# Patient Record
Sex: Female | Born: 1960 | Race: White | Hispanic: No | Marital: Married | Smoking: Never smoker
Health system: Southern US, Community
[De-identification: ages and names within clinical notes are randomized; demographics above are authoritative.]

## PROBLEM LIST (undated history)

## (undated) DIAGNOSIS — L409 Psoriasis, unspecified: Secondary | ICD-10-CM

## (undated) DIAGNOSIS — M1990 Unspecified osteoarthritis, unspecified site: Secondary | ICD-10-CM

## (undated) DIAGNOSIS — Z780 Asymptomatic menopausal state: Secondary | ICD-10-CM

## (undated) DIAGNOSIS — T7840XA Allergy, unspecified, initial encounter: Secondary | ICD-10-CM

## (undated) HISTORY — DX: Asymptomatic menopausal state: Z78.0

## (undated) HISTORY — DX: Psoriasis, unspecified: L40.9

## (undated) HISTORY — DX: Unspecified osteoarthritis, unspecified site: M19.90

## (undated) HISTORY — DX: Allergy, unspecified, initial encounter: T78.40XA

---

## 1999-02-23 ENCOUNTER — Other Ambulatory Visit: Admission: RE | Admit: 1999-02-23 | Discharge: 1999-02-23 | Payer: Self-pay | Admitting: Obstetrics and Gynecology

## 1999-09-09 ENCOUNTER — Inpatient Hospital Stay (HOSPITAL_COMMUNITY): Admission: AD | Admit: 1999-09-09 | Discharge: 1999-09-13 | Payer: Self-pay | Admitting: Obstetrics and Gynecology

## 1999-10-17 ENCOUNTER — Other Ambulatory Visit: Admission: RE | Admit: 1999-10-17 | Discharge: 1999-10-17 | Payer: Self-pay | Admitting: Obstetrics and Gynecology

## 2007-02-05 ENCOUNTER — Other Ambulatory Visit: Admission: RE | Admit: 2007-02-05 | Discharge: 2007-02-05 | Payer: Self-pay | Admitting: Obstetrics & Gynecology

## 2008-02-06 ENCOUNTER — Other Ambulatory Visit: Admission: RE | Admit: 2008-02-06 | Discharge: 2008-02-06 | Payer: Self-pay | Admitting: Obstetrics & Gynecology

## 2009-03-31 ENCOUNTER — Ambulatory Visit: Payer: Self-pay | Admitting: Gynecology

## 2012-11-22 LAB — HEPATIC FUNCTION PANEL
ALT: 20 U/L (ref 7–35)
Alkaline Phosphatase: 74 U/L (ref 25–125)

## 2012-11-22 LAB — CBC AND DIFFERENTIAL
Hemoglobin: 14.4 g/dL (ref 12.0–16.0)
Platelets: 222 10*3/uL (ref 150–399)
WBC: 4.8 10^3/mL

## 2012-11-22 LAB — LIPID PANEL
Cholesterol: 221 mg/dL — AB (ref 0–200)
HDL: 97 mg/dL — AB (ref 35–70)

## 2013-01-09 ENCOUNTER — Ambulatory Visit (INDEPENDENT_AMBULATORY_CARE_PROVIDER_SITE_OTHER): Payer: BC Managed Care – PPO | Admitting: Gynecology

## 2013-01-09 ENCOUNTER — Encounter: Payer: Self-pay | Admitting: Gynecology

## 2013-01-09 VITALS — BP 120/82 | Ht 64.5 in | Wt 168.0 lb

## 2013-01-09 DIAGNOSIS — R935 Abnormal findings on diagnostic imaging of other abdominal regions, including retroperitoneum: Secondary | ICD-10-CM

## 2013-01-09 DIAGNOSIS — R9389 Abnormal findings on diagnostic imaging of other specified body structures: Secondary | ICD-10-CM

## 2013-01-09 DIAGNOSIS — N949 Unspecified condition associated with female genital organs and menstrual cycle: Secondary | ICD-10-CM

## 2013-01-09 DIAGNOSIS — N840 Polyp of corpus uteri: Secondary | ICD-10-CM

## 2013-01-09 DIAGNOSIS — R140 Abdominal distension (gaseous): Secondary | ICD-10-CM | POA: Insufficient documentation

## 2013-01-09 DIAGNOSIS — R102 Pelvic and perineal pain: Secondary | ICD-10-CM

## 2013-01-09 DIAGNOSIS — Z01419 Encounter for gynecological examination (general) (routine) without abnormal findings: Secondary | ICD-10-CM

## 2013-01-09 DIAGNOSIS — N951 Menopausal and female climacteric states: Secondary | ICD-10-CM | POA: Insufficient documentation

## 2013-01-09 DIAGNOSIS — R141 Gas pain: Secondary | ICD-10-CM

## 2013-01-09 LAB — CBC WITH DIFFERENTIAL/PLATELET
Eosinophils Absolute: 0.2 10*3/uL (ref 0.0–0.7)
Eosinophils Relative: 3 % (ref 0–5)
Lymphocytes Relative: 41 % (ref 12–46)
Lymphs Abs: 2.2 10*3/uL (ref 0.7–4.0)
MCHC: 34.5 g/dL (ref 30.0–36.0)
Monocytes Absolute: 0.3 10*3/uL (ref 0.1–1.0)
Monocytes Relative: 6 % (ref 3–12)

## 2013-01-09 LAB — COMPREHENSIVE METABOLIC PANEL
ALT: 28 U/L (ref 0–35)
AST: 27 U/L (ref 0–37)
Albumin: 4.8 g/dL (ref 3.5–5.2)
Calcium: 9.7 mg/dL (ref 8.4–10.5)
Chloride: 103 mEq/L (ref 96–112)
Potassium: 3.9 mEq/L (ref 3.5–5.3)

## 2013-01-09 LAB — FOLLICLE STIMULATING HORMONE: FSH: 81.7 m[IU]/mL

## 2013-01-09 MED ORDER — PAROXETINE MESYLATE 7.5 MG PO CAPS: 7.5000 mg | ORAL_CAPSULE | Freq: Every day | ORAL | Status: DC

## 2013-01-09 NOTE — Patient Instructions (Addendum)
Menopause Menopause is the normal time of life when menstrual periods stop completely. Menopause is complete when you have missed 12 consecutive menstrual periods. It usually occurs between the ages of 48 to 55, with an average age of 51. Very rarely does a woman develop menopause before 53 years old. At menopause, your ovaries stop producing the female hormones, estrogen and progesterone. This can cause undesirable symptoms and also affect your health. Sometimes the symptoms may occur 4 to 5 years before the menopause begins. There is no relationship between menopause and:  Oral contraceptives.  Number of children you had.  Race.  The age your menstrual periods started (menarche). Heavy smokers and very thin women may develop menopause earlier in life. CAUSES  The ovaries stop producing the female hormones estrogen and progesterone.  Other causes include:  Surgery to remove both ovaries.  The ovaries stop functioning for no known reason.  Tumors of the pituitary gland in the brain.  Medical disease that affects the ovaries and hormone production.  Radiation treatment to the abdomen or pelvis.  Chemotherapy that affects the ovaries. SYMPTOMS   Hot flashes.  Night sweats.  Decrease in sex drive.  Vaginal dryness and thinning of the vagina causing painful intercourse.  Dryness of the skin and developing wrinkles.  Headaches.  Tiredness.  Irritability.  Memory problems.  Weight gain.  Bladder infections.  Hair growth of the face and chest.  Infertility. More serious symptoms include:  Loss of bone (osteoporosis) causing breaks (fractures).  Depression.  Hardening and narrowing of the arteries (atherosclerosis) causing heart attacks and strokes. DIAGNOSIS   When the menstrual periods have stopped for 12 straight months.  Physical exam.  Hormone studies of the blood. TREATMENT  There are many treatment choices and nearly as many questions about them.  The decisions to treat or not to treat menopausal changes is an individual choice made with your caregiver. Your caregiver can discuss the treatments with you. Together, you can decide which treatment will work best for you. Your treatment choices may include:   Hormone therapy (estorgen and progesterone).  Non-hormonal medications.  Treating the individual symptoms with medication (for example antidepressants for depression).  Herbal medications that may help specific symptoms.  Counseling by a psychiatrist or psychologist.  Group therapy.  Lifestyle changes including:  Eating healthy.  Regular exercise.  Limiting caffeine and alcohol.  Stress management and meditation.  No treatment. HOME CARE INSTRUCTIONS   Take the medication your caregiver gives you as directed.  Get plenty of sleep and rest.  Exercise regularly.  Eat a diet that contains calcium (good for the bones) and soy products (acts like estrogen hormone).  Avoid alcoholic beverages.  Do not smoke.  If you have hot flashes, dress in layers.  Take supplements, calcium and vitamin D to strengthen bones.  You can use over-the-counter lubricants or moisturizers for vaginal dryness.  Group therapy is sometimes very helpful.  Acupuncture may be helpful in some cases. SEEK MEDICAL CARE IF:   You are not sure you are in menopause.  You are having menopausal symptoms and need advice and treatment.  You are still having menstrual periods after age 55.  You have pain with intercourse.  Menopause is complete (no menstrual period for 12 months) and you develop vaginal bleeding.  You need a referral to a specialist (gynecologist, psychiatrist or psychologist) for treatment. SEEK IMMEDIATE MEDICAL CARE IF:   You have severe depression.  You have excessive vaginal bleeding.  You fell and   think you have a broken bone.  You have pain when you urinate.  You develop leg or chest pain.  You have a fast  pounding heart beat (palpitations).  You have severe headaches.  You develop vision problems.  You feel a lump in your breast.  You have abdominal pain or severe indigestion. Document Released: 01/27/2004 Document Revised: 01/29/2012 Document Reviewed: 09/03/2008 ExitCare Patient Information 2013 [employer].   Hormone Therapy At menopause, your body begins making less estrogen and progesterone hormones. This causes the body to stop having menstrual periods. This is because estrogen and progesterone hormones control your periods and menstrual cycle. A lack of estrogen may cause symptoms such as:  Hot flushes (or hot flashes).  Vaginal dryness.  Dry skin.  Loss of sex drive.  Risk of bone loss (osteoporosis). When this happens, you may choose to take hormone therapy to get back the estrogen lost during menopause. When the hormone estrogen is given alone, it is usually referred to as ET (Estrogen Therapy). When the hormone progestin is combined with estrogen, it is generally called HT (Hormone Therapy). This was formerly known as hormone replacement therapy (HRT). Your caregiver can help you make a decision on what will be best for you. The decision to use HT seems to change often as new studies are done. Many studies do not agree on the benefits of hormone replacement therapy. LIKELY BENEFITS OF HT INCLUDE PROTECTION FROM:  Hot Flushes (also called hot flashes) - A hot flush is a sudden feeling of heat that spreads over the face and body. The skin may redden like a blush. It is connected with sweats and sleep disturbance. Women going through menopause may have hot flushes a few times a month or several times per day depending on the woman.  Osteoporosis (bone loss)- Estrogen helps guard against bone loss. After menopause, a woman's bones slowly lose calcium and become weak and brittle. As a result, bones are more likely to break. The hip, wrist, and spine are affected most often.  Hormone therapy can help slow bone loss after menopause. Weight bearing exercise and taking calcium with vitamin D also can help prevent bone loss. There are also medications that your caregiver can prescribe that can help prevent osteoporosis.  Vaginal Dryness - Loss of estrogen causes changes in the vagina. Its lining may become thin and dry. These changes can cause pain and bleeding during sexual intercourse. Dryness can also lead to infections. This can cause burning and itching. (Vaginal estrogen treatment can help relieve pain, itching, and dryness.)  Urinary Tract Infections are more common after menopause because of lack of estrogen. Some women also develop urinary incontinence because of low estrogen levels in the vagina and bladder.  Possible other benefits of estrogen include a positive effect on mood and short-term memory in women. RISKS AND COMPLICATIONS  Using estrogen alone without progesterone causes the lining of the uterus to grow. This increases the risk of lining of the uterus (endometrial) cancer. Your caregiver should give another hormone called progestin if you have a uterus.  Women who take combined (estrogen and progestin) HT appear to have an increased risk of breast cancer. The risk appears to be small, but increases throughout the time that HT is taken.  Combined therapy also makes the breast tissue slightly denser which makes it harder to read mammograms (breast X-rays).  Combined, estrogen and progesterone therapy can be taken together every day, in which case there may be spotting of blood. HT   therapy can be taken cyclically in which case you will have menstrual periods. Cyclically means HT is taken for a set amount of days, then not taken, then this process is repeated.  HT may increase the risk of stroke, heart attack, breast cancer and forming blood clots in your leg.  Transdermal estrogen (estrogen that is absorbed through the skin with a patch or a cream) may  have more positive results with:  Cholesterol.  Blood pressure.  Blood clots. Having the following conditions may indicate you should not have HT:  Endometrial cancer.  Liver disease.  Breast cancer.  Heart disease.  History of blood clots.  Stroke. TREATMENT   If you choose to take HT and have a uterus, usually estrogen and progestin are prescribed.  Your caregiver will help you decide the best way to take the medications.  Possible ways to take estrogen include:  Pills.  Patches.  Gels.  Sprays.  Vaginal estrogen cream, rings and tablets.  It is best to take the lowest dose possible that will help your symptoms and take them for the shortest period of time that you can.  Hormone therapy can help relieve some of the problems (symptoms) that affect women at menopause. Before making a decision about HT, talk to your caregiver about what is best for you. Be well informed and comfortable with your decisions. HOME CARE INSTRUCTIONS   Follow your caregivers advice when taking the medications.  A Pap test is done to screen for cervical cancer.  The first Pap test should be done at age 21.  Between ages 21 and 29, Pap tests are repeated every 2 years.  Beginning at age 30, you are advised to have a Pap test every 3 years as long as your past 3 Pap tests have been normal.  Some women have medical problems that increase the chance of getting cervical cancer. Talk to your caregiver about these problems. It is especially important to talk to your caregiver if a new problem develops soon after your last Pap test. In these cases, your caregiver may recommend more frequent screening and Pap tests.  The above recommendations are the same for women who have or have not gotten the vaccine for HPV (Human Papillomavirus).  If you had a hysterectomy for a problem that was not a cancer or a condition that could lead to cancer, then you no longer need Pap tests. However, even if  you no longer need a Pap test, a regular exam is a good idea to make sure no other problems are starting.   If you are between ages 65 and 70, and you have had normal Pap tests going back 10 years, you no longer need Pap tests. However, even if you no longer need a Pap test, a regular exam is a good idea to make sure no other problems are starting.   If you have had past treatment for cervical cancer or a condition that could lead to cancer, you need Pap tests and screening for cancer for at least 20 years after your treatment.  If Pap tests have been discontinued, risk factors (such as a new sexual partner) need to be re-assessed to determine if screening should be resumed.  Some women may need screenings more often if they are at high risk for cervical cancer.  Get mammograms done as per the advice of your caregiver. SEEK IMMEDIATE MEDICAL CARE IF:  You develop abnormal vaginal bleeding.  You have pain or swelling in your legs,   shortness of breath, or chest pain.  You develop dizziness or headaches.  You have lumps or changes in your breasts or armpits.  You have slurred speech.  You develop weakness or numbness of your arms or legs.  You have pain, burning, or bleeding when urinating.  You develop abdominal pain. Document Released: 08/05/2003 Document Revised: 01/29/2012 Document Reviewed: 11/23/2010 ExitCare Patient Information 2013 [employer].   

## 2013-01-09 NOTE — Progress Notes (Signed)
Amanda Barber 05/25/1961 [id number]   History:    52 y.o.  for annual gyn exam who has not been seen in the office since 2010. She brought some records from her prior physician hearing Ginette Otto dating 2011 2012. It appears that she had a thickened endometrium in 2011 2012. We do not have any notes from 2013. She does states that she does not recall she had an endometrial biopsy or not. She is having some mild vasomotor symptoms but but no dyspareunia or vaginal dryness. She is feeling bloated achy in her lower abdomen but states that she has no GU or GI complaints. Her husband has had a vasectomy. She has not had a menstrual cycle since last year. Patient denies any prior history of abnormal Pap smears. She refused the flu vaccine and states her Tdap vaccine is up-to-date. Her last mammogram was in 2012 which was reported to be normal.  Past medical history,surgical history, family history and social history were all reviewed and documented in the EPIC chart.  Gynecologic History Patient's last menstrual period was 07/09/2012. Contraception: vasectomy Last Pap: 2012. Results were: normal Last mammogram: 2012. Results were: normal  Obstetric History OB History   Grav Para Term Preterm Abortions TAB SAB Ect Mult Living   2 2        2      # Outc Date GA Lbr Len/2nd Wgt Sex Del Anes PTL Lv   1 PAR            2 PAR                ROS: A ROS was performed and pertinent positives and negatives are included in the history.  GENERAL: No fevers or chills. HEENT: No change in vision, no earache, sore throat or sinus congestion. NECK: No pain or stiffness. CARDIOVASCULAR: No chest pain or pressure. No palpitations. PULMONARY: No shortness of breath, cough or wheeze. GASTROINTESTINAL: No abdominal pain, nausea, vomiting or diarrhea, melena or bright red blood per rectum. GENITOURINARY: No urinary frequency, urgency, hesitancy or dysuria. MUSCULOSKELETAL: No joint or muscle pain, no back pain, no recent  trauma. DERMATOLOGIC: No rash, no itching, no lesions. ENDOCRINE: No polyuria, polydipsia, no heat or cold intolerance. No recent change in weight. HEMATOLOGICAL: No anemia or easy bruising or bleeding. NEUROLOGIC: No headache, seizures, numbness, tingling or weakness. PSYCHIATRIC: No depression, no loss of interest in normal activity or change in sleep pattern.     Exam: chaperone present  BP 120/82  Ht 5' 4.5" (1.638 m)  Wt 168 lb (76.204 kg)  BMI 28.4 kg/m2  LMP 07/09/2012  Body mass index is 28.4 kg/(m^2).  General appearance : Well developed well nourished female. No acute distress HEENT: Neck supple, trachea midline, no carotid bruits, no thyroidmegaly Lungs: Clear to auscultation, no rhonchi or wheezes, or rib retractions  Heart: Regular rate and rhythm, no murmurs or gallops Breast:Examined in sitting and supine position were symmetrical in appearance, no palpable masses or tenderness,  no skin retraction, no nipple inversion, no nipple discharge, no skin discoloration, no axillary or supraclavicular lymphadenopathy Abdomen: no palpable masses or tenderness, no rebound or guarding Extremities: no edema or skin discoloration or tenderness  Pelvic:  Bartholin, Urethra, Skene Glands: Within normal limits             Vagina: No gross lesions or discharge  Cervix: No gross lesions or discharge  Uterus  anteverted, normal size, shape and consistency, non-tender and mobile  Adnexa  Without masses  or tenderness  Anus and perineum  normal   Rectovaginal  normal sphincter tone without palpated masses or tenderness             Hemoccult not done Bovie referred to gastroenterologist for screening colonoscopy     Assessment/Plan:  52 y.o. female for annual exam who appears to be in a menopausal state. We will check her [facility] today along with her CBC, comprehensive metabolic panel. She was counseled for an endometrial biopsy because of the history of thickened endometrium. The cervix was  cleansed with Betadine solution and a sterile Pipelle was introduced into the intrauterine cavity and tissue was obtained for histological evaluation. Patient will return back next week for sonohysterogram to look at the intrauterine cavity as well as endometrial thickness and look at her ovaries. If all the above tests are normal we will wait until she sees the gastroenterologist for further evaluation as well as to proceed with her colonoscopy since she has not had a baseline. She denies any family history of any GI malignancies or polyps. She was given a requisition to schedule her mammogram. We discussed importance of monthly self breast examination. We discussed importance of calcium vitamin D for osteoporosis prevention. We discussed some time next year to proceed with doing a baseline bone density study. As to her vasomotor symptoms she was taking unopposed estrogen from over-the-counter which I told her that she had to discontinue. We did discussed Brisdelle 7.5 mg to help with her symptoms this being a nonhormonal product (Paroxetine) which she was interested and prescription and literature was provided.    Ok Edwards MD, 12:54 PM 01/09/2013

## 2013-01-10 ENCOUNTER — Encounter: Payer: Self-pay | Admitting: Gynecology

## 2013-01-10 ENCOUNTER — Other Ambulatory Visit: Payer: Self-pay | Admitting: Gynecology

## 2013-01-10 DIAGNOSIS — Z1231 Encounter for screening mammogram for malignant neoplasm of breast: Secondary | ICD-10-CM

## 2013-01-20 ENCOUNTER — Ambulatory Visit: Payer: BC Managed Care – PPO | Admitting: Gynecology

## 2013-01-20 ENCOUNTER — Other Ambulatory Visit: Payer: BC Managed Care – PPO

## 2013-01-22 ENCOUNTER — Other Ambulatory Visit: Payer: Self-pay | Admitting: Gynecology

## 2013-02-07 ENCOUNTER — Ambulatory Visit (INDEPENDENT_AMBULATORY_CARE_PROVIDER_SITE_OTHER): Payer: BC Managed Care – PPO | Admitting: Gynecology

## 2013-02-07 ENCOUNTER — Other Ambulatory Visit: Payer: Self-pay | Admitting: Gynecology

## 2013-02-07 ENCOUNTER — Ambulatory Visit (INDEPENDENT_AMBULATORY_CARE_PROVIDER_SITE_OTHER): Payer: BC Managed Care – PPO

## 2013-02-07 DIAGNOSIS — R9389 Abnormal findings on diagnostic imaging of other specified body structures: Secondary | ICD-10-CM

## 2013-02-07 DIAGNOSIS — N840 Polyp of corpus uteri: Secondary | ICD-10-CM

## 2013-02-07 DIAGNOSIS — Z780 Asymptomatic menopausal state: Secondary | ICD-10-CM

## 2013-02-07 DIAGNOSIS — R935 Abnormal findings on diagnostic imaging of other abdominal regions, including retroperitoneum: Secondary | ICD-10-CM

## 2013-02-07 NOTE — Progress Notes (Signed)
Patient is a 52 year old who was asked to come in to the office today to discuss her recent endometrial biopsy and to do a sonohysterogram today. She was seen in the office on February 20 her annual gynecological exam. Review of her record and indicated that she had been evaluated in the past by another gynecologist in the community and the records indicated that in 2011, in 2012 there appeared to be endometrial thickening on ultrasound. She was also here to discuss her vasomotor symptoms and to confirm that her [facility] was in the menopausal range. Her husband has had a vasectomy. She had been complaining of bloating and lower abdominal discomfort and has not had a screening colonoscopy yet.  FSH elevated 85  Endometrial biopsy done 01/09/2013: Endometrium, biopsy, uterus - FRAGMENTS OF BENIGN ENDOMETRIAL-TYPE POLYP. - BACKGROUND PROLIFERATIVE PATTERN ENDOMETRIUM. - NO HYPERPLASIA, ATYPIA OR MALIGNANCY IDENTIFIED.  Sonohysterogram today: Ultrasound demonstrated uterus measured 8.5 x 5.4 x 4.8 cm with endometrial stripe of 5.1 mm. Normal-appearing uterus and ovaries. The cervix was then cleansed with Betadine solution and a sterile Pipelle was introduced into the intrauterine cavity and no defects were noted.  Assessment/plan: Patient will be referred to the gastroenterologist for her overdue baseline screening colonoscopy. She was reassured of the findings on the ultrasound and endometrial biopsy. Her vasomotor symptoms are not as significant but are there and I had prescribed her Brisdelle 7.5 mg daily to help with her symptoms which she will begin to take shortly.

## 2013-02-07 NOTE — Patient Instructions (Addendum)

## 2013-02-10 ENCOUNTER — Encounter: Payer: Self-pay | Admitting: Gastroenterology

## 2013-02-10 ENCOUNTER — Telehealth: Payer: Self-pay

## 2013-02-10 NOTE — Telephone Encounter (Signed)
Patient called asking me where she had scheduled her mammogram at.  I do not have a record of that and told her most likely [facility] and provided her with that ph number. I also gave her [place] phone number. She feels she probably missed her appointment.  She will call and if she cannot locate place she will call me back.

## 2013-02-12 ENCOUNTER — Encounter: Payer: Self-pay | Admitting: Gynecology

## 2013-02-13 ENCOUNTER — Ambulatory Visit: Payer: BC Managed Care – PPO

## 2013-03-04 ENCOUNTER — Ambulatory Visit: Payer: BC Managed Care – PPO

## 2013-03-11 ENCOUNTER — Encounter: Payer: Self-pay | Admitting: Gastroenterology

## 2013-03-11 ENCOUNTER — Other Ambulatory Visit (INDEPENDENT_AMBULATORY_CARE_PROVIDER_SITE_OTHER): Payer: BC Managed Care – PPO

## 2013-03-11 ENCOUNTER — Ambulatory Visit (INDEPENDENT_AMBULATORY_CARE_PROVIDER_SITE_OTHER): Payer: BC Managed Care – PPO | Admitting: Gastroenterology

## 2013-03-11 VITALS — BP 110/70 | HR 80 | Ht 67.0 in | Wt 166.5 lb

## 2013-03-11 DIAGNOSIS — N390 Urinary tract infection, site not specified: Secondary | ICD-10-CM

## 2013-03-11 DIAGNOSIS — R140 Abdominal distension (gaseous): Secondary | ICD-10-CM

## 2013-03-11 DIAGNOSIS — R109 Unspecified abdominal pain: Secondary | ICD-10-CM

## 2013-03-11 DIAGNOSIS — R141 Gas pain: Secondary | ICD-10-CM

## 2013-03-11 LAB — URINALYSIS, ROUTINE W REFLEX MICROSCOPIC
Nitrite: NEGATIVE
Total Protein, Urine: NEGATIVE
Urine Glucose: NEGATIVE
pH: 7 (ref 5.0–8.0)

## 2013-03-11 MED ORDER — MOVIPREP 100 G PO SOLR: 1.0000 | Freq: Once | ORAL | Status: DC

## 2013-03-11 NOTE — Patient Instructions (Addendum)
You will have labs checked today in the basement lab.  Please head down after you check out with the front desk  (urine analysis, urine culture, celiac panel). Trial of dairy free. You will be set up for a colonoscopy for routine screening (LEC, moderate sedation).                                               We are excited to introduce MyChart, a new best-in-class service that provides you online access to important information in your electronic medical record. We want to make it easier for you to view your health information - all in one secure location - when and where you need it. We expect MyChart will enhance the quality of care and service we provide.  When you register for MyChart, you can:    View your test results.    Request appointments and receive appointment reminders via email.    Request medication renewals.    View your medical history, allergies, medications and immunizations.    Communicate with your physician's office through a password-protected site.    Conveniently print information such as your medication lists.  To find out if MyChart is right for you, please talk to a member of our clinical staff today. We will gladly answer your questions about this free health and wellness tool.  If you are age 40 or older and want a member of your family to have access to your record, you must provide written consent by completing a proxy form available at our office. Please speak to our clinical staff about guidelines regarding accounts for patients younger than age 64.  As you activate your MyChart account and need any technical assistance, please call the MyChart technical support line at [phone]-CHART [phone]) or email your question to [email or web address]. If you email your question(s), please include your name, a return phone number and the best time to reach you.  If you have non-urgent health-related questions, you can send a message to our office through  [email or web address]. If you have a medical emergency, call 911.  Thank you for using MyChart as your new health and wellness resource!   MyChart licensed from [employer],  [id number]. Patents Pending.

## 2013-03-11 NOTE — Progress Notes (Signed)
HPI: This is a    very pleasant 52 year old woman whom I am meeting for the first time today.  Gyne workup reassuring recently.    She has trouble with bloating.  Can have a lot of more persistent bloating.  She has 2-3 times per week.  Hasn't paid much attention.  Has improved.  She was having some back aches as well.  Muscularly sore in abd.  January she stayed off dairy.  Two recent UTIs this past January.  "Finally" went through menopause.  Has not tried the recommended supplements.  Has had psoriasis in the past.  No real bowel troubles.  Pretty regular.  No overt bleeding.    She would like a urinalysis today to check to see if she still has urinary tract infection    Review of systems: Pertinent positive and negative review of systems were noted in the above HPI section. Complete review of systems was performed and was otherwise normal.    Past Medical History  Diagnosis Date  . NSVD (normal spontaneous vaginal delivery)   . Anxiety   . Psoriasis   . Urinary tract infection     Past Surgical History  Procedure Laterality Date  . Cesarean section      Current Outpatient Prescriptions  Medication Sig Dispense Refill  . Loratadine-Pseudoephedrine (CLARITIN-D 24 HOUR PO) Take 1 tablet by mouth daily.       No current facility-administered medications for this visit.    Allergies as of 03/11/2013 - Review Complete 03/11/2013  Allergen Reaction Noted  . Penicillins  01/09/2013    Family History  Problem Relation Age of Onset  . Hypertension Mother   . Atrial fibrillation Mother     History   Social History  . Marital Status: Married    Spouse Name: N/A    Number of Children: N/A  . Years of Education: N/A   Occupational History  . Not on file.   Social History Main Topics  . Smoking status: Never Smoker   . Smokeless tobacco: Never Used  . Alcohol Use: Yes     Comment: OCC  . Drug Use: No  . Sexually Active: Yes   Other Topics Concern  .  Not on file   Social History Narrative  . No narrative on file       Physical Exam: BP 110/70  Pulse 80  Ht 5\' 7"  (1.702 m)  Wt 166 lb 8 oz (75.524 kg)  BMI 26.07 kg/m2  LMP 07/09/2012 Constitutional: generally well-appearing Psychiatric: alert and oriented x3 Eyes: extraocular movements intact Mouth: oral pharynx moist, no lesions Neck: supple no lymphadenopathy Cardiovascular: heart regular rate and rhythm Lungs: clear to auscultation bilaterally Abdomen: soft, nontender, nondistended, no obvious ascites, no peritoneal signs, normal bowel sounds Extremities: no lower extremity edema bilaterally Skin: no lesions on visible extremities    Assessment and plan: 52 y.o. female with  intermittent bloating, routine risk for colon cancer  I suspect her bloating symptom is mostly related to dietary intake, perhaps underlying celiac sprue. She will get a blood test, celiac sprue panel. She requests a urinalysis which I am happy to do since she thinks she might have urinary tract infection again. She is due for colonoscopy for routine cancer screening and we will set that up for her as well. She will do a dairy free trial to see if that is planning a role in some of her bloating

## 2013-03-12 LAB — URINE CULTURE: Colony Count: 8000

## 2013-03-12 LAB — CELIAC PANEL 10
Gliadin IgG: 3.6 U/mL (ref <20)
IgA: 151 mg/dL (ref 69–380)

## 2013-03-13 ENCOUNTER — Telehealth: Payer: Self-pay | Admitting: Gastroenterology

## 2013-03-13 NOTE — Telephone Encounter (Signed)
Pt was notified that the results have not been reviewed as of yet.  I will call as soon as available

## 2013-03-24 ENCOUNTER — Encounter: Payer: Self-pay | Admitting: Internal Medicine

## 2013-03-24 ENCOUNTER — Encounter: Payer: Self-pay | Admitting: Gastroenterology

## 2013-03-24 NOTE — Telephone Encounter (Signed)
Amanda Barber/Jordan - No hurry that i can see - just an FYI re: "cancelation list" request

## 2013-03-31 ENCOUNTER — Other Ambulatory Visit: Payer: BC Managed Care – PPO | Admitting: Gastroenterology

## 2013-04-09 ENCOUNTER — Ambulatory Visit: Payer: BC Managed Care – PPO

## 2013-04-29 ENCOUNTER — Other Ambulatory Visit (HOSPITAL_COMMUNITY): Payer: Self-pay | Admitting: Rheumatology

## 2013-04-29 DIAGNOSIS — Z780 Asymptomatic menopausal state: Secondary | ICD-10-CM

## 2013-04-30 ENCOUNTER — Ambulatory Visit (HOSPITAL_COMMUNITY)
Admission: RE | Admit: 2013-04-30 | Discharge: 2013-04-30 | Disposition: A | Discharge status: Home / Self Care | Payer: BC Managed Care – PPO | Source: Ambulatory Visit | Attending: Rheumatology | Admitting: Rheumatology

## 2013-04-30 DIAGNOSIS — Z780 Asymptomatic menopausal state: Secondary | ICD-10-CM

## 2013-04-30 DIAGNOSIS — Z13820 Encounter for screening for osteoporosis: Secondary | ICD-10-CM | POA: Insufficient documentation

## 2013-07-09 ENCOUNTER — Encounter: Payer: Self-pay | Admitting: Internal Medicine

## 2013-07-09 ENCOUNTER — Ambulatory Visit (INDEPENDENT_AMBULATORY_CARE_PROVIDER_SITE_OTHER): Payer: BC Managed Care – PPO | Admitting: Internal Medicine

## 2013-07-09 VITALS — BP 112/82 | HR 69 | Temp 98.4°F | Ht 67.0 in | Wt 177.1 lb

## 2013-07-09 DIAGNOSIS — F909 Attention-deficit hyperactivity disorder, unspecified type: Secondary | ICD-10-CM | POA: Insufficient documentation

## 2013-07-09 DIAGNOSIS — N951 Menopausal and female climacteric states: Secondary | ICD-10-CM | POA: Insufficient documentation

## 2013-07-09 DIAGNOSIS — F411 Generalized anxiety disorder: Secondary | ICD-10-CM | POA: Insufficient documentation

## 2013-07-09 DIAGNOSIS — Z1211 Encounter for screening for malignant neoplasm of colon: Secondary | ICD-10-CM

## 2013-07-09 DIAGNOSIS — N959 Unspecified menopausal and perimenopausal disorder: Secondary | ICD-10-CM

## 2013-07-09 DIAGNOSIS — K589 Irritable bowel syndrome without diarrhea: Secondary | ICD-10-CM | POA: Insufficient documentation

## 2013-07-09 MED ORDER — PAROXETINE HCL 10 MG PO TABS: 10.0000 mg | ORAL_TABLET | ORAL | Status: DC

## 2013-07-09 NOTE — Assessment & Plan Note (Signed)
Reviewed negative evaluation for other causes of fatigue and irritability by numerous other specialists throughout 2014. Reviewed possible overlap with ADD/hyperactive state. Will refer to behavioral health for further battery testing to clarify same. Associated with IBS and symptomatic menopausal transition - Reviewed efficacy of SSRI for management of all these symptoms/conditions Patient understands and agrees to medication trial, low-dose generic paroxetine selected we reviewed potential risk/benefit and possible side effects - pt understands and agrees to same  Followup in 6 weeks on medication and symptoms, titrate as needed

## 2013-07-09 NOTE — Progress Notes (Signed)
Subjective:    Patient ID: Amanda Barber, female    DOB: 1961/08/18, 52 y.o.   MRN: [id number]  HPI  New patient to me, transfer from [facility] to establish with new primary care provider (her mother is my patient -Cecile Sheerer) Reviewed chronic medical issues today:  Arthralgias, question psoriatic arthritis. Working with rheumatology for same since January 2014. Takes over-the-counter anti-inflammatory and/or Tylenol for same  GI issues: bloating, discomfort -GI evaluation in spring 2014 for same unremarkable for celiac disease. Improves with careful attention to diet such as dairy restriction and minimized fried food/fats. Has not yet proceeded with screening colonoscopy as recommended  Postmenopausal symptoms. Gyn evaluation for same spring 2014 reviewed. Did not begin trial of SSRI as recommended.  Anxiety. Manifest with irritability, excitability. Never on medications for same. Exacerbated by hormonal changes with menopause. Overlap with hyperactivity and questions possible diagnosis of ADD given prevalence of similar symptoms throughout childhood and adult life, worsened since menopause.  Past Medical History  Diagnosis Date  . NSVD (normal spontaneous vaginal delivery)   . Anxiety   . Psoriasis   . Postmenopausal   . Psoriatic arthritis    Family History  Problem Relation Age of Onset  . Hypertension Mother   . Atrial fibrillation Mother    History  Substance Use Topics  . Smoking status: Never Smoker   . Smokeless tobacco: Never Used  . Alcohol Use: Yes     Comment: OCC    Review of Systems Constitutional: Negative for fever; +unexpected weight gain.  Respiratory: Negative for cough and shortness of breath.   Cardiovascular: Negative for chest pain or palpitations.  Gastrointestinal: No bowel changes. chronic bloating, distention, "gurgling" Musculoskeletal: Negative for gait problem or joint swelling. Generalized arthralgias Skin: Negative for rash.  Neurological:  Negative for dizziness or headache.  No other specific complaints in a complete review of systems (except as listed in HPI above).     Objective:   Physical Exam BP 112/82  Pulse 69  Temp(Src) 98.4 F (36.9 C) (Oral)  Ht 5\' 7"  (1.702 m)  Wt 177 lb 1.9 oz (80.341 kg)  BMI 27.73 kg/m2  SpO2 95%  LMP 07/09/2012 Wt Readings from Last 3 Encounters:  07/09/13 177 lb 1.9 oz (80.341 kg)  03/11/13 166 lb 8 oz (75.524 kg)  01/09/13 168 lb (76.204 kg)   Constitutional: She appears well-developed and well-nourished. No distress.  HENT: Head: Normocephalic and atraumatic. Ears: B TMs ok, no erythema or effusion; Nose: Nose normal. Mouth/Throat: Oropharynx is clear and moist. No oropharyngeal exudate.  Eyes: Conjunctivae and EOM are normal. Pupils are equal, round, and reactive to light. No scleral icterus.  Neck: Normal range of motion. Neck supple. No JVD present. No thyromegaly present.  Cardiovascular: Normal rate, regular rhythm and normal heart sounds.  No murmur heard. No BLE edema. Pulmonary/Chest: Effort normal and breath sounds normal. No respiratory distress. She has no wheezes.  Abdominal: Soft. Bowel sounds are normal. She exhibits no distension. There is no tenderness. no masses Musculoskeletal: Normal range of motion, no joint effusions. No gross deformities Neurological: She is alert and oriented to person, place, and time. No cranial nerve deficit. Coordination, balance, strength, speech and gait are normal.  Skin: Skin is warm and dry. No rash noted. No erythema.  Psychiatric: She has an anxious and distractible mood and affect. Her behavior is hyperactive. Judgment and thought content normal.   Lab Results  Component Value Date   WBC 5.3 01/09/2013   HGB  14.0 01/09/2013   HCT 40.6 01/09/2013   PLT 253 01/09/2013   GLUCOSE 85 01/09/2013   CHOL 221* 11/22/2012   TRIG 92 11/22/2012   HDL 97* 11/22/2012   LDLCALC 105 11/22/2012   ALT 28 01/09/2013   AST 27 01/09/2013   NA 142 01/09/2013    K 3.9 01/09/2013   CL 103 01/09/2013   CREATININE 0.70 01/09/2013   BUN 10 01/09/2013   CO2 29 01/09/2013   TSH 1.21 11/22/2012       Assessment & Plan:   See problem list. Medications and labs reviewed today.  Time spent with pt today 60 minutes, greater than 50% time spent counseling patient on anxiety, PMP symptoms, IBS and ?ADD. Also medication review and review of prior records as brought with patient from other providers today and in our EMR

## 2013-07-09 NOTE — Assessment & Plan Note (Signed)
Reviewed gynecology evaluation February 2014. Reassurance on diagnosis including symptom manifestation as experienced by patient. Patient agrees to low dose SSRI trial for management of symptoms -ex done

## 2013-07-09 NOTE — Patient Instructions (Signed)
It was good to see you today. We have reviewed your prior records including labs and tests today Medications reviewed and updated: start low dose fluoxetine now for postmenopausal symptoms, irritable bowel and anxiety No other medication changes recommended at this time. Your prescription(s) have been submitted to your pharmacy. Please take as directed and contact our office if you believe you are having problem(s) with the medication(s). we'll make referral to  Behavioral health for evaluation of possible ADD. Also we'll make referral to new provider and gastroenterology for screening colonoscopy + IBS. Our office will contact you regarding appointment(s) once made. Please schedule followup in 6 weeks to review symptoms and medications, call sooner if problems.  Diet and Irritable Bowel Syndrome  No cure has been found for irritable bowel syndrome (IBS). Many options are available to treat the symptoms. Your caregiver will give you the best treatments available for your symptoms. He or she will also encourage you to manage stress and to make changes to your diet. You need to work with your caregiver and Registered Dietician to find the best combination of medicine, diet, counseling, and support to control your symptoms. The following are some diet suggestions. FOODS THAT MAKE IBS WORSE  Fatty foods, such as [place] fries.  Milk products, such as cheese or ice cream.  Chocolate.  Alcohol.  Caffeine (found in coffee and some sodas).  Carbonated drinks, such as soda. If certain foods cause symptoms, you should eat less of them or stop eating them. FOOD JOURNAL   Keep a journal of the foods that seem to cause distress. Write down:  What you are eating during the day and when.  What problems you are having after eating.  When the symptoms occur in relation to your meals.  What foods always make you feel badly.  Take your notes with you to your caregiver to see if you should stop eating  certain foods. FOODS THAT MAKE IBS BETTER Fiber reduces IBS symptoms, especially constipation, because it makes stools soft, bulky, and easier to pass. Fiber is found in bran, bread, cereal, beans, fruit, and vegetables. Examples of foods with fiber include:  Apples.  Peaches.  Pears.  Berries.  Figs.  Broccoli, raw.  Cabbage.  Carrots.  Raw peas.  Kidney beans.  Lima beans.  Whole-grain bread.  Whole-grain cereal. Add foods with fiber to your diet a little at a time. This will let your body get used to them. Too much fiber at once might cause gas and swelling of your abdomen. This can trigger symptoms in a person with IBS. Caregivers usually recommend a diet with enough fiber to produce soft, painless bowel movements. High fiber diets may cause gas and bloating. However, these symptoms often go away within a few weeks, as your body adjusts. In many cases, dietary fiber may lessen IBS symptoms, particularly constipation. However, it may not help pain or diarrhea. High fiber diets keep the colon mildly enlarged (distended) with the added fiber. This may help prevent spasms in the colon. Some forms of fiber also keep water in the stool, thereby preventing hard stools that are difficult to pass.  Besides telling you to eat more foods with fiber, your caregiver may also tell you to get more fiber by taking a fiber pill or drinking water mixed with a special high fiber powder. An example of this is a natural fiber laxative containing psyllium seed.  TIPS  Large meals can cause cramping and diarrhea in people with IBS. If this  happens to you, try eating 4 or 5 small meals a day, or try eating less at each of your usual 3 meals. It may also help if your meals are low in fat and high in carbohydrates. Examples of carbohydrates are pasta, rice, whole-grain breads and cereals, fruits, and vegetables.  If dairy products cause your symptoms to flare up, you can try eating less of those foods.  You might be able to handle yogurt better than other dairy products, because it contains bacteria that helps with digestion. Dairy products are an important source of calcium and other nutrients. If you need to avoid dairy products, be sure to talk with a Registered Dietitian about getting these nutrients through other food sources.  Drink enough water and fluids to keep your urine clear or pale yellow. This is important, especially if you have diarrhea. FOR MORE INFORMATION  International Foundation for Functional Gastrointestinal Disorders: www.iffgd.org  National Digestive Diseases Information Clearinghouse: digestive.StageSync.si Document Released: 01/27/2004 Document Revised: 01/29/2012 Document Reviewed: 10/14/2007 [facility] Patient Information 2014 [place], [place]. Irritable Bowel Syndrome You have a problem with your large bowel. This is called irritable bowel syndrome or spastic colon. It can cause cramping in the lower belly. You may also have more rumbling, gurgling, and bloating. Diarrhea and mucus in the stool are common. Constipation with hard small stools is also common. The pain will often recede after a bowel movement. This problem can come and go for months or years. The cause is unknown. A poor diet and emotional stress can add to the problems. There is no test to prove you have irritable bowel. Some tests and X-rays may be needed to be sure you do not have a more serious problem. TREATMENT  Diet - Increase fiber and bulk (whole grains, bran, vegetables, fruit) and reduce fats (fried foods, dairy products, meats).  Exercise regularly. Use walking or other exercises to reduce your stress.  Try a psyllium product. One tablespoon in water two times daily may be helpful.  For diarrhea from irritable bowel, avoid food containing fructose and lactose.  Reduce gas and bloating by avoiding milk products, beans, onions, carrots, prunes, apricots, bananas, raisins, pretzels, and  bagels.  See your caregiver for follow-up as recommended. SEEK IMMEDIATE MEDICAL CARE IF:  You have increasing abdominal pain.  You have lasting diarrhea.  You have severe constipation.  You have blood in your stools.  You have problems urinating.  You have a fever. Medications to treat spasms, diarrhea, anxiety, or depression may also be beneficial. Your caregiver can help you with this. MAKE SURE YOU:   Understand these instructions.  Will watch your condition.  Will get help right away if you are not doing well or get worse. Document Released: 11/06/2005 Document Revised: 01/29/2012 Document Reviewed: 06/05/2007 [facility] Patient Information 2014 [place], [place].

## 2013-07-09 NOTE — Assessment & Plan Note (Signed)
Chronic bloating and discomfort, improved with diet changes, exacerbated by anxiety and stress.  Reviewed GI consultation from spring 2014 Provided education on IBS diagnosis, begin low dose SSRI for treatment of same with overlapping anxiety and postmenopausal symptoms. Encourage followup with GI for screening colonoscopy, will request a different provider per patient preference

## 2013-07-11 ENCOUNTER — Encounter: Payer: Self-pay | Admitting: Internal Medicine

## 2013-07-28 ENCOUNTER — Ambulatory Visit: Payer: BC Managed Care – PPO | Admitting: Licensed Clinical Social Worker

## 2013-08-14 ENCOUNTER — Encounter: Payer: Self-pay | Admitting: Gastroenterology

## 2013-08-22 ENCOUNTER — Institutional Professional Consult (permissible substitution): Payer: BC Managed Care – PPO | Admitting: Internal Medicine

## 2013-08-25 ENCOUNTER — Other Ambulatory Visit (INDEPENDENT_AMBULATORY_CARE_PROVIDER_SITE_OTHER): Payer: Self-pay

## 2013-08-25 ENCOUNTER — Ambulatory Visit (INDEPENDENT_AMBULATORY_CARE_PROVIDER_SITE_OTHER): Payer: Self-pay | Admitting: Internal Medicine

## 2013-08-25 ENCOUNTER — Encounter: Payer: Self-pay | Admitting: Internal Medicine

## 2013-08-25 VITALS — BP 120/78 | HR 59 | Temp 98.0°F | Wt 180.4 lb

## 2013-08-25 DIAGNOSIS — R300 Dysuria: Secondary | ICD-10-CM

## 2013-08-25 DIAGNOSIS — F411 Generalized anxiety disorder: Secondary | ICD-10-CM

## 2013-08-25 DIAGNOSIS — K589 Irritable bowel syndrome without diarrhea: Secondary | ICD-10-CM

## 2013-08-25 LAB — URINALYSIS, ROUTINE W REFLEX MICROSCOPIC
Nitrite: NEGATIVE
Specific Gravity, Urine: 1.01 (ref 1.000–1.030)
Total Protein, Urine: NEGATIVE
pH: 7.5 (ref 5.0–8.0)

## 2013-08-25 MED ORDER — PAROXETINE HCL 20 MG PO TABS: 20.0000 mg | ORAL_TABLET | ORAL | Status: DC

## 2013-08-25 NOTE — Assessment & Plan Note (Signed)
Reviewed negative evaluation for other causes of fatigue and irritability by numerous other specialists throughout 2014. Reviewed possible overlap with ADD/hyperactive state. Unable to schedule with behavioral health due to cost at this time - consider future evaluation with battery testing to further clarify same as needed. Associated with IBS and symptomatic menopausal transition - August 2014 began low-dose generic Paxil for same symptoms, improved but not yet at goal. Titrate up now - new erx done Followup in 3-4 months on medication and symptoms, titrate as needed

## 2013-08-25 NOTE — Patient Instructions (Signed)
It was good to see you today. We have reviewed your prior records including labs and tests today Medications reviewed and updated, titrate up paroxetine to 20 mg daily at this time -no other changes Your prescription(s) have been submitted to your pharmacy. Please take as directed and contact our office if you believe you are having problem(s) with the medication(s). Test(s) ordered today. Your results will be released to MyChart (or called to you) after review, usually within 72hours after test completion. If any changes need to be made, you will be notified at that same time. Please schedule followup in 3-4 months for symptoms recheck and review, call sooner if problems.

## 2013-08-25 NOTE — Assessment & Plan Note (Signed)
Chronic bloating and discomfort, improved with diet changes, exacerbated by anxiety and stress. Reviewed GI consultation from spring 2014 Provided education on IBS diagnosis, began low dose SSRI 06/2013 for treatment of same with overlapping anxiety and postmenopausal symptoms. Reports improved Encourage followup with GI for screening colonoscopy, requested a different provider per patient preference (pending wit Christella Hartigan)

## 2013-08-25 NOTE — Progress Notes (Signed)
  Subjective:    Patient ID: Amanda Barber, female    DOB: Feb 25, 1961, 52 y.o.   MRN: [id number]  HPI  Patient here today for follow up.  Chronic medical issues also reviewed.  Arthralgias, question psoriatic arthritis. Working with rheumatology for same since January 2014. Takes over-the-counter anti-inflammatory and/or Tylenol for same   GI issues: bloating, discomfort -GI evaluation in spring 2014 for same unremarkable for celiac disease. Improves with careful attention to diet such as dairy restriction and minimized fried food/fats. Referral made to Dr Christella Hartigan at last visit - appt not yet scheduled for screening colonoscopy.  She is planning to call and reschedule.  She thinks that Probiotics help with symptoms.  Postmenopausal symptoms. Gyn evaluation for same spring 2014 reviewed.  Low dose Paxil started August 2014.   Anxiety. Manifest with irritability, excitability. Not previously on medications for same prior to 06/2013. Exacerbated by hormonal changes with menopause. Overlap with hyperactivity and questions possible diagnosis of ADD given prevalence of similar symptoms throughout childhood and adult life, worsened since menopause.  Referral made to behavioral counselor in August of 2014, but pt cancelled appt due to cost. Paxil started at visit in August of 2014, pt thinks this is helping with mood/anxiety, but not yet at goal -  Weight gain - up 14 pounds since April 2014.  Trying to change diet with decrease in gluten and increase activity level.    Past Medical History  Diagnosis Date  . NSVD (normal spontaneous vaginal delivery)   . Anxiety   . Psoriasis   . Postmenopausal   . Psoriatic arthritis     Review of Systems  Constitutional: Negative for fever, chills, activity change and fatigue.  Respiratory: Negative for cough, shortness of breath and wheezing.   Cardiovascular: Negative for chest pain and palpitations.  Gastrointestinal: Positive for abdominal distention  (bloating). Negative for nausea, vomiting, diarrhea and constipation.  Genitourinary: Positive for dysuria and frequency. Negative for hematuria and flank pain.  Neurological: Negative for dizziness and headaches.       Objective:   Physical Exam  Vitals reviewed. Constitutional: She is oriented to person, place, and time. She appears well-developed and well-nourished. No distress.  HENT:  Head: Normocephalic and atraumatic.  Cardiovascular: Normal rate, regular rhythm and normal heart sounds.   No murmur heard. Pulmonary/Chest: Breath sounds normal. No respiratory distress. She has no wheezes.  Abdominal: Soft. Bowel sounds are normal.  Neurological: She is alert and oriented to person, place, and time.  Skin: Skin is warm and dry. No rash noted. She is not diaphoretic.  Psychiatric: She has a normal mood and affect. Her behavior is normal. Judgment and thought content normal.    Wt Readings from Last 3 Encounters:  08/25/13 180 lb 6.4 oz (81.829 kg)  07/09/13 177 lb 1.9 oz (80.341 kg)  03/11/13 166 lb 8 oz (75.524 kg)   BP Readings from Last 3 Encounters:  08/25/13 120/78  07/09/13 112/82  03/11/13 110/70        Assessment & Plan:  See problem list also -  Dysuria - intermittent symptoms, will check U/A today and treat if abnormal.

## 2013-09-01 ENCOUNTER — Ambulatory Visit (INDEPENDENT_AMBULATORY_CARE_PROVIDER_SITE_OTHER): Payer: No Typology Code available for payment source | Admitting: Licensed Clinical Social Worker

## 2013-09-01 DIAGNOSIS — F411 Generalized anxiety disorder: Secondary | ICD-10-CM

## 2013-09-02 ENCOUNTER — Encounter: Payer: Self-pay | Admitting: Internal Medicine

## 2013-09-02 MED ORDER — PAROXETINE HCL 10 MG PO TABS: 10.0000 mg | ORAL_TABLET | ORAL | Status: DC

## 2013-09-03 ENCOUNTER — Other Ambulatory Visit: Payer: Self-pay | Admitting: Physician Assistant

## 2013-09-15 ENCOUNTER — Ambulatory Visit (INDEPENDENT_AMBULATORY_CARE_PROVIDER_SITE_OTHER): Payer: No Typology Code available for payment source | Admitting: Licensed Clinical Social Worker

## 2013-09-15 DIAGNOSIS — F411 Generalized anxiety disorder: Secondary | ICD-10-CM

## 2013-09-25 ENCOUNTER — Other Ambulatory Visit: Payer: Self-pay

## 2013-10-01 ENCOUNTER — Encounter: Payer: Self-pay | Admitting: Gastroenterology

## 2013-10-08 ENCOUNTER — Ambulatory Visit (INDEPENDENT_AMBULATORY_CARE_PROVIDER_SITE_OTHER): Payer: No Typology Code available for payment source | Admitting: Women's Health

## 2013-10-08 ENCOUNTER — Encounter: Payer: Self-pay | Admitting: Women's Health

## 2013-10-08 DIAGNOSIS — Z79890 Hormone replacement therapy (postmenopausal): Secondary | ICD-10-CM

## 2013-10-08 MED ORDER — ESTRADIOL 1 MG PO TABS: 1.0000 mg | ORAL_TABLET | Freq: Every day | ORAL | Status: DC

## 2013-10-08 MED ORDER — PROGESTERONE MICRONIZED 200 MG PO CAPS: ORAL_CAPSULE | ORAL | Status: DC

## 2013-10-08 NOTE — Progress Notes (Signed)
Patient ID: Amanda Barber, female   DOB: [birth year], 52 y.o.   MRN: [id number] Presents with numerous menopausal symptoms and generally not feeling well. States has had  increasing fatigue, tiredness, jointpain, bloating, weight gain. FSH 81 12/2012 amenorrheic for greater than one year. Had a normal sonohysterogram with a negative biopsy 12/2012. Normal DEXA at primary care.  Was seen at a dermatologist and was given and a cycle and, developed a rash on the upper inner arms bilaterally. Currently on a prednisone Dosepak from dermatologist. Has been seen by a GI Dr., internist, psychologist. Also questions if she has ADD. Reports has had numerous labs all normal.  Exam: Appears well but anxious. Red papular rash bilateral upper inner arms  Menopausal symptoms Rash most likely from minocycline Anxiety  Plan: HRT reviewed, reviewed risks of blood clots, strokes, breast cancer. Options reviewed. Will try Prometrium 200 day one through 12 each month and estradiol 1 mg daily. Reviewed importance of daily regular exercise, taking time for self. Instructed to schedule followup with GI Dr. for colonoscopy. Followup with psychologist for further testing for ADD/anxiety. Instructed to call if no relief of symptoms.

## 2013-10-08 NOTE — Patient Instructions (Signed)

## 2013-10-09 ENCOUNTER — Telehealth: Payer: Self-pay | Admitting: *Deleted

## 2013-10-09 NOTE — Telephone Encounter (Signed)
Change is OK with me if it is OK with Dr. Rhea Belton.

## 2013-10-09 NOTE — Telephone Encounter (Signed)
Janus Molder, CMA Alice Rieger, RN            Pt scheduled for preop colonsocopy with Dr Christella Hartigan she canceled this year earlier bc wasn't too fond of him, can she switch to another provider there? Can she have Dr Rhea Belton there do the colonoscopy?      This is what was sent to me Dr. Christella Hartigan from Dr. Leretha Dykes offices.

## 2013-10-09 NOTE — Telephone Encounter (Signed)
Why is she asking to change?  I saw her once, in April, recommended colonoscoyp which she never showed for.

## 2013-10-09 NOTE — Telephone Encounter (Signed)
Dr. Rhea Belton,  Are you willing to accept this patient?

## 2013-10-09 NOTE — Telephone Encounter (Signed)
Dr. Christella Hartigan, this patient is requesting to change providers from you to Dr. Rhea Belton. Are you willing to release her care?

## 2013-10-10 NOTE — Telephone Encounter (Signed)
Okay 

## 2013-10-14 ENCOUNTER — Other Ambulatory Visit: Payer: No Typology Code available for payment source

## 2013-10-14 ENCOUNTER — Telehealth: Payer: Self-pay | Admitting: *Deleted

## 2013-10-14 ENCOUNTER — Encounter: Payer: Self-pay | Admitting: Internal Medicine

## 2013-10-14 ENCOUNTER — Ambulatory Visit (INDEPENDENT_AMBULATORY_CARE_PROVIDER_SITE_OTHER): Payer: No Typology Code available for payment source | Admitting: Internal Medicine

## 2013-10-14 VITALS — BP 128/74 | HR 82 | Ht 67.0 in | Wt 181.0 lb

## 2013-10-14 DIAGNOSIS — J309 Allergic rhinitis, unspecified: Secondary | ICD-10-CM

## 2013-10-14 DIAGNOSIS — R21 Rash and other nonspecific skin eruption: Secondary | ICD-10-CM

## 2013-10-14 DIAGNOSIS — J302 Other seasonal allergic rhinitis: Secondary | ICD-10-CM

## 2013-10-14 MED ORDER — AZITHROMYCIN 250 MG PO TABS: ORAL_TABLET | ORAL | Status: DC

## 2013-10-14 NOTE — Progress Notes (Signed)
10/14/13-  52 yoF never smoker self-referral-had allergy shots in childhood,has rash "all over" 3 wks.ago.Took Minocycline 5 wks ago. Tried Progesterone cream at same time frame..Taper off Prednisone 5 days ago.Rash slightly better.Has GI bloating. Describes rash for the past one or 2 months. Dermatologist attributed it to reaction to minocycline and she was treated with prednisone, just finished. Has known history is of psoriasis and rosacea since the early 1980s. Long history of allergic rhinitis. Allergy vaccine as a child for common environmental allergens. Complains of dry nose, retro-orbital headaches. No recognized reactions to insects or foods. Celiac workup negative. 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 Some mold-did home test kit. 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 Family history-father had sinusitis.  Prior to Admission medications   Medication Sig Start Date End Date Taking? Authorizing Provider  estradiol (ESTRACE) 1 MG tablet Take 1 tablet (1 mg total) by mouth daily. 10/08/13  Yes Harrington Challenger, NP  progesterone (PROMETRIUM) 200 MG capsule Day 1 through 12 at bedtime 10/08/13  Yes Harrington Challenger, NP  azithromycin (ZITHROMAX) 250 MG tablet Take 2 tablets on day 1 and then take 1 tablet daily on days 2-5 10/19/13   Waymon Budge, MD   Past Medical History  Diagnosis Date  . NSVD (normal spontaneous vaginal delivery)   . Anxiety   . Psoriasis   . Postmenopausal   . Psoriatic arthritis    Past Surgical History  Procedure Laterality Date  . Cesarean section  2000   Family History  Problem Relation Age of Onset  . Hypertension Mother   . Atrial fibrillation Mother    History   Social History  . Marital Status: Married    Spouse Name: N/A    Number of Children: N/A  . Years of Education: N/A   Occupational History  . Stay at home mom   . 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 Smoking status: Never Smoker   . Smokeless tobacco: Never Used  . Alcohol Use: Yes     Comment: OCC  . Drug Use: No  . Sexual Activity: Yes   Other Topics Concern  . Not on file   Social History Narrative   Lives with husband and 2 children (son and daughter).   ROS-see HPI Constitutional:   No-   weight loss, night sweats, fevers, chills, fatigue, lassitude. HEENT:   No-  headaches, difficulty swallowing, tooth/dental problems, sore throat,       No-  sneezing, itching, ear ache, +nasal congestion, +hepost nasal drip,  CV:  No-   chest pain, orthopnea, PND, swelling in lower extremities, anasarca, dizziness, palpitations Resp: No-   shortness of breath with exertion or at rest.              No-   productive cough,  No non-productive cough,  No- coughing up of blood.              No-   change in color of mucus.  No- wheezing.   Skin: +HPI GI:  + heartburn, indigestion, +abdominal pain, nausea, vomiting, diarrhea,                 change in bowel habits, loss of appetite GU: No-   dysuria, change in color of urine, no urgency or frequency.  No- flank pain. MS:  No-   joint pain or swelling.  No- decreased range of  motion.  No- back pain. Neuro-     nothing unusual Psych:  No- change in mood or affect. No depression or anxiety.  No memory loss.  OBJ- Physical Exam General- Alert, Oriented, Affect-appropriate, Distress- none acute Skin- + with very dry. +Few papular small lesions under makeup on her face Lymphadenopathy- none Head- atraumatic            Eyes- Gross vision intact, PERRLA, conjunctivae and secretions clear            Ears- Hearing, canals-normal            Nose- Clear, no-Septal dev, mucus, polyps, erosion, perforation             Throat- Mallampati II , mucosa clear , drainage- none, tonsils- atrophic Neck- flexible , trachea midline, no stridor , thyroid nl, carotid no bruit Chest - symmetrical excursion , unlabored           Heart/CV- RRR  , no murmur , no gallop  , no rub, nl s1 s2                           - JVD- none , edema- none, stasis changes- none, varices- none           Lung- clear to P&A, wheeze- none, cough- none , dullness-none, rub- none           Chest wall-  Abd- tender-no, distended-no, bowel sounds-present, HSM- no Br/ Gen/ Rectal- Not done, not indicated Extrem- cyanosis- none, clubbing, none, atrophy- none, strength- nl Neuro- grossly intact to observation

## 2013-10-14 NOTE — Telephone Encounter (Signed)
I informed pt that provider request change in GI was approved to Dr Margretta Sidle. She was calling them now to make an appt. KW

## 2013-10-14 NOTE — Patient Instructions (Signed)
Order- referral to [facility]  Dx rash  Order lab- Allergy profile, Food IgE profile      Script for [employer]  Suggest trying an otc antihistamine like Allegra or Claritin  Suggest an otc skin moisturizer- Cetaphil, Eucerin, Lubriderm, Aveeno

## 2013-10-15 LAB — ALLERGEN FOOD PROFILE SPECIFIC IGE
Chicken IgE: <0.1 kU/L
Egg White IgE: <0.1 kU/L
Fish Cod: <0.1 kU/L
IgE (Immunoglobulin E), Serum: 4.8 IU/mL (ref 0.0–180.0)
Peanut IgE: <0.1 kU/L
Shrimp IgE: <0.1 kU/L
Tomato IgE: <0.1 kU/L
Tuna IgE: <0.1 kU/L
Wheat IgE: <0.1 kU/L

## 2013-10-15 LAB — ALLERGY FULL PROFILE
Allergen, D pternoyssinus,d7: 0.28 kU/L — ABNORMAL HIGH
Allergen,Goose feathers, e70: <0.1 kU/L
Aspergillus fumigatus, m3: <0.1 kU/L
Bahia Grass: <0.1 kU/L
Box Elder IgE: <0.1 kU/L
Common Ragweed: <0.1 kU/L
Curvularia lunata: <0.1 kU/L
D. farinae: 0.37 kU/L — ABNORMAL HIGH
Elm IgE: <0.1 kU/L
Fescue: <0.1 kU/L
G005 Rye, Perennial: <0.1 kU/L
Goldenrod: <0.1 kU/L
Helminthosporium halodes: <0.1 kU/L
House Dust Hollister: <0.1 kU/L
Lamb's Quarters: <0.1 kU/L
Oak: <0.1 kU/L
Stemphylium Botryosum: <0.1 kU/L
Timothy Grass: <0.1 kU/L

## 2013-10-19 ENCOUNTER — Other Ambulatory Visit: Payer: Self-pay | Admitting: Internal Medicine

## 2013-10-20 NOTE — Telephone Encounter (Signed)
Ok to fill 

## 2013-10-20 NOTE — Telephone Encounter (Signed)
Please advise if okay to refill; just given on 10-14-13. Thanks.

## 2013-10-21 ENCOUNTER — Other Ambulatory Visit: Payer: Self-pay | Admitting: Internal Medicine

## 2013-10-22 ENCOUNTER — Telehealth: Payer: Self-pay | Admitting: Internal Medicine

## 2013-10-22 MED ORDER — AZITHROMYCIN 250 MG PO TABS: ORAL_TABLET | ORAL | Status: DC

## 2013-10-22 NOTE — Telephone Encounter (Signed)
Ok to try Zpak for this as requested.

## 2013-10-22 NOTE — Telephone Encounter (Signed)
Called, spoke with pt.  Informed her of below.  She verbalized understanding and is aware zpak was sent to [facility].

## 2013-10-22 NOTE — Telephone Encounter (Signed)
  We recived a refill request through MyChart for a Zpack. I sent Amanda Barber a message and asked if there was a reason she was needing this medication refilled. She states that she had a rash the last time she saw him and would like this refilled because the rash has not cleared up.  Allergies  Allergen Reactions  . Penicillins   . Minocycline Rash    Current Outpatient Prescriptions on File Prior to Visit  Medication Sig Dispense Refill  . azithromycin (ZITHROMAX Z-PAK) 250 MG tablet 2 today then one daily  6 each  0  . estradiol (ESTRACE) 1 MG tablet Take 1 tablet (1 mg total) by mouth daily.  90 tablet  4  . progesterone (PROMETRIUM) 200 MG capsule Day 1 through 12 at bedtime  36 capsule  4   No current facility-administered medications on file prior to visit.    CY - please advise. Thanks.

## 2013-10-23 ENCOUNTER — Telehealth: Payer: Self-pay | Admitting: *Deleted

## 2013-10-23 NOTE — Telephone Encounter (Signed)
Pt informed with the below note. She will follow up if needed. 

## 2013-10-23 NOTE — Telephone Encounter (Signed)
Pt is currently taking progesterone 200 mg day 1-12 of each month with estradiol 1 mg daily. Pt said it seems to be working good, she was talking to her PCP and she though maybe pt should be on a longer dose/ take daily? Pt also has a skin rash that is noted on OV 10/08/13 she has tried several different medications and if the progesterone could possibility cause rash to get worse? Pt said it is very painful and now on her leg and thighs. I told pt I wouldn't think it would make worse, but I would run this concern by you.Please advise

## 2013-10-23 NOTE — Telephone Encounter (Signed)
Please call and review progestin/Prometrium safest route is cyclic, (12 days/month) Prometrium can also be given daily. Rash started after minocycline, may be best to F/U with dermatologist  Prometrium rarely causes a rash. Other option would be to stop the HRT and see if rash resolves.

## 2013-11-02 DIAGNOSIS — J302 Other seasonal allergic rhinitis: Secondary | ICD-10-CM | POA: Insufficient documentation

## 2013-11-02 DIAGNOSIS — R21 Rash and other nonspecific skin eruption: Secondary | ICD-10-CM | POA: Insufficient documentation

## 2013-11-02 NOTE — Assessment & Plan Note (Signed)
Skin test positive and on allergy vaccine as a child She seems to be describing some degree of seasonal rhinitis, although the nonspecific rash was a more recent concern. Plan-general allergy profile and food allergy profiles. Antihistamines as needed

## 2013-11-02 NOTE — Assessment & Plan Note (Addendum)
A few small nonspecific reddened raised spots on her face are covered with makeup and difficult to assess. I cannot test for contact dermatitis triggers and would defer to patch testing by a dermatologist for that question. She describes this as a perennial process without clear association to any specific trigger. She asked for dermatology referral, wishing a different opinion rather than returning to Dr.Tafeen at this time.

## 2013-11-19 ENCOUNTER — Encounter: Payer: Self-pay | Admitting: Internal Medicine

## 2013-11-24 ENCOUNTER — Telehealth: Payer: Self-pay

## 2013-11-24 NOTE — Telephone Encounter (Signed)
Patient had sent her questionnaire back and indicated she is having some issues with HRT and would like to discuss, possibly change dose or discontinue.  She also, had mentioned wanting labs done, etc.  I recommended she schedule visit to come and see you and she did and will see you next Tuesday 12/02/13.  Meantime, she has had vaginal bleeding x 3 days and wondered is that normal with first month on HRT?  Has not had vaginal bleeding since 2013 until now.  It is not heavy. Light bleeding. She hopes that you just think it if a normal reaction to the HRT.

## 2013-11-25 ENCOUNTER — Ambulatory Visit: Payer: No Typology Code available for payment source | Admitting: Internal Medicine

## 2013-11-25 NOTE — Telephone Encounter (Signed)
Please call and review she  taking cyclic progestin (Prometrium day one through 12 each month) is common to have some bleeding. She had a negative sonohysterogram and biopsy 12/2012

## 2013-11-25 NOTE — Telephone Encounter (Signed)
Patient informed. 

## 2013-11-26 ENCOUNTER — Ambulatory Visit: Payer: Self-pay | Admitting: Internal Medicine

## 2013-11-26 DIAGNOSIS — Z0289 Encounter for other administrative examinations: Secondary | ICD-10-CM

## 2013-12-02 ENCOUNTER — Ambulatory Visit: Payer: No Typology Code available for payment source | Admitting: Women's Health

## 2013-12-03 ENCOUNTER — Other Ambulatory Visit: Payer: BC Managed Care – PPO | Admitting: Gastroenterology

## 2013-12-03 ENCOUNTER — Ambulatory Visit: Payer: No Typology Code available for payment source | Admitting: Women's Health

## 2013-12-18 ENCOUNTER — Encounter: Payer: No Typology Code available for payment source | Admitting: Internal Medicine

## 2014-01-05 ENCOUNTER — Telehealth: Payer: Self-pay

## 2014-01-05 ENCOUNTER — Encounter: Payer: Self-pay | Admitting: Women's Health

## 2014-01-05 NOTE — Telephone Encounter (Signed)
Wrong patient

## 2014-01-05 NOTE — Telephone Encounter (Signed)
Patient sent the following email "Hi Izora Gala, I would like to have blood work done for my thyroid - the "total panel", along with blood work for my hormone levels (estrogen, progesterone and testosterone. Also Vitamin D. How do I go about getting that done in your lab downstairs before I come in for my follow up with you about the HRT I am on? "  If you are okay with her having all those labs drawn I will give her written order on Rx pad to take with her downstairs to [facility] on Floor [address].

## 2014-01-06 NOTE — Telephone Encounter (Signed)
Ok to have labs drawn, she can have drawn at our office if she likes.  ask if the HRT she is on is helping?

## 2014-01-07 ENCOUNTER — Other Ambulatory Visit: Payer: Self-pay | Admitting: Women's Health

## 2014-01-07 DIAGNOSIS — Z01419 Encounter for gynecological examination (general) (routine) without abnormal findings: Secondary | ICD-10-CM

## 2014-01-07 DIAGNOSIS — N951 Menopausal and female climacteric states: Secondary | ICD-10-CM

## 2014-01-07 DIAGNOSIS — N959 Unspecified menopausal and perimenopausal disorder: Secondary | ICD-10-CM

## 2014-01-07 DIAGNOSIS — Z1329 Encounter for screening for other suspected endocrine disorder: Secondary | ICD-10-CM

## 2014-01-07 DIAGNOSIS — Z833 Family history of diabetes mellitus: Secondary | ICD-10-CM

## 2014-01-07 NOTE — Telephone Encounter (Signed)
Thanks, thyroid panel - 246.9 diag code ok, no FSH, already elevated so don't need to repeat.  Vit D - postmenopausal, estrogen, progesterone, testosterone -  HRT,  CMET - v18.0, cbc- v72.31   I think that was everything.

## 2014-01-07 NOTE — Telephone Encounter (Signed)
Amanda Barber, I went ahead and put the orders and diagnoses in best I could as patient as not wanting me to wait for your return.  Anyway, in answer to your question patient answered "I don't feel the HRT is helping. The whole stomach bloated thing is just really getting to me! Not feeling myself for so long makes me wonder if  Thyroid?"  Thyroid panel was one of the things she wanted checked and was ordered.  I advised her to be sure she schedules her annual exam as that is what it is time for as well.

## 2014-01-07 NOTE — Telephone Encounter (Signed)
Nancy-Please tell me what you want her to have drawn?  Usual CBC/CMET? Her request-- [place]? Progesterone level? Testosterone level? Vit D Level?    I just want to be sure I order the correct things and what diagnosis should I use for the hormones?

## 2014-01-07 NOTE — Telephone Encounter (Signed)
Orders put in. Patient informed by email that orders are in to call for lab appt.

## 2014-01-12 ENCOUNTER — Other Ambulatory Visit: Payer: No Typology Code available for payment source

## 2014-01-12 ENCOUNTER — Encounter: Payer: Self-pay | Admitting: Women's Health

## 2014-01-12 DIAGNOSIS — Z01419 Encounter for gynecological examination (general) (routine) without abnormal findings: Secondary | ICD-10-CM

## 2014-01-12 DIAGNOSIS — N951 Menopausal and female climacteric states: Secondary | ICD-10-CM

## 2014-01-12 DIAGNOSIS — Z1329 Encounter for screening for other suspected endocrine disorder: Secondary | ICD-10-CM

## 2014-01-12 DIAGNOSIS — N959 Unspecified menopausal and perimenopausal disorder: Secondary | ICD-10-CM

## 2014-01-12 LAB — THYROID PANEL WITH TSH
Free Thyroxine Index: 3.1 (ref 1.0–3.9)
T3 Uptake: 30 % (ref 22.5–37.0)
T4 TOTAL: 10.3 ug/dL (ref 5.0–12.5)
TSH: 1.363 u[IU]/mL (ref 0.350–4.500)

## 2014-01-12 LAB — CBC WITH DIFFERENTIAL/PLATELET
Basophils Absolute: 0.1 10*3/uL (ref 0.0–0.1)
Basophils Relative: 1 % (ref 0–1)
EOS ABS: 0.1 10*3/uL (ref 0.0–0.7)
Eosinophils Relative: 2 % (ref 0–5)
HCT: 41.7 % (ref 36.0–46.0)
Hemoglobin: 14 g/dL (ref 12.0–15.0)
LYMPHS PCT: 38 % (ref 12–46)
Lymphs Abs: 1.9 10*3/uL (ref 0.7–4.0)
MCH: 31.3 pg (ref 26.0–34.0)
MCHC: 33.6 g/dL (ref 30.0–36.0)
MCV: 93.1 fL (ref 78.0–100.0)
MONOS PCT: 9 % (ref 3–12)
Monocytes Absolute: 0.5 10*3/uL (ref 0.1–1.0)
NEUTROS ABS: 2.5 10*3/uL (ref 1.7–7.7)
NEUTROS PCT: 50 % (ref 43–77)
PLATELETS: 242 10*3/uL (ref 150–400)
RBC: 4.48 MIL/uL (ref 3.87–5.11)
RDW: 12.4 % (ref 11.5–15.5)
WBC: 5 10*3/uL (ref 4.0–10.5)

## 2014-01-12 LAB — FOLLICLE STIMULATING HORMONE: FSH: 48.7 m[IU]/mL

## 2014-01-12 LAB — COMPREHENSIVE METABOLIC PANEL
ALBUMIN: 4 g/dL (ref 3.5–5.2)
ALT: 22 U/L (ref 0–35)
AST: 20 U/L (ref 0–37)
Alkaline Phosphatase: 91 U/L (ref 39–117)
BILIRUBIN TOTAL: 0.6 mg/dL (ref 0.2–1.2)
BUN: 13 mg/dL (ref 6–23)
CHLORIDE: 103 meq/L (ref 96–112)
CO2: 27 mEq/L (ref 19–32)
Calcium: 9 mg/dL (ref 8.4–10.5)
Creat: 0.78 mg/dL (ref 0.50–1.10)
Glucose, Bld: 95 mg/dL (ref 70–99)
Potassium: 3.7 mEq/L (ref 3.5–5.3)
SODIUM: 139 meq/L (ref 135–145)
Total Protein: 6.2 g/dL (ref 6.0–8.3)

## 2014-01-12 LAB — PROGESTERONE: PROGESTERONE: 0.3 ng/mL

## 2014-01-12 LAB — TESTOSTERONE: Testosterone: 41 ng/dL (ref 10–70)

## 2014-01-13 ENCOUNTER — Encounter: Payer: Self-pay | Admitting: Women's Health

## 2014-01-13 LAB — VITAMIN D 25 HYDROXY (VIT D DEFICIENCY, FRACTURES): VIT D 25 HYDROXY: 66 ng/mL (ref 30–89)

## 2014-01-20 ENCOUNTER — Ambulatory Visit: Payer: No Typology Code available for payment source | Admitting: Women's Health

## 2014-01-23 ENCOUNTER — Encounter: Payer: No Typology Code available for payment source | Admitting: Women's Health

## 2014-04-09 ENCOUNTER — Ambulatory Visit (INDEPENDENT_AMBULATORY_CARE_PROVIDER_SITE_OTHER): Payer: No Typology Code available for payment source | Admitting: Women's Health

## 2014-04-09 ENCOUNTER — Encounter: Payer: Self-pay | Admitting: Women's Health

## 2014-04-09 ENCOUNTER — Other Ambulatory Visit (HOSPITAL_COMMUNITY)
Admission: RE | Admit: 2014-04-09 | Discharge: 2014-04-09 | Disposition: A | Discharge status: Home / Self Care | Payer: No Typology Code available for payment source | Source: Ambulatory Visit | Attending: Gynecology | Admitting: Gynecology

## 2014-04-09 VITALS — BP 120/70 | Ht 67.0 in | Wt 186.0 lb

## 2014-04-09 DIAGNOSIS — Z01419 Encounter for gynecological examination (general) (routine) without abnormal findings: Secondary | ICD-10-CM

## 2014-04-09 NOTE — Patient Instructions (Signed)
Health Recommendations for Postmenopausal Women Respected and ongoing research has looked at the most common causes of death, disability, and poor quality of life in postmenopausal women. The causes include heart disease, diseases of blood vessels, diabetes, depression, cancer, and bone loss (osteoporosis). Many things can be done to help lower the chances of developing these and other common problems: CARDIOVASCULAR DISEASE Heart Disease: A heart attack is a medical emergency. Know the signs and symptoms of a heart attack. Below are things women can do to reduce their risk for heart disease.   Do not smoke. If you smoke, quit.  Aim for a healthy weight. Being overweight causes many preventable deaths. Eat a healthy and balanced diet and drink an adequate amount of liquids.  Get moving. Make a commitment to be more physically active. Aim for 30 minutes of activity on most, if not all days of the week.  Eat for heart health. Choose a diet that is low in saturated fat and cholesterol and eliminate trans fat. Include whole grains, vegetables, and fruits. Read and understand the labels on food containers before buying.  Know your numbers. Ask your caregiver to check your blood pressure, cholesterol (total, HDL, LDL, triglycerides) and blood glucose. Work with your caregiver on improving your entire clinical picture.  High blood pressure. Limit or stop your table salt intake (try salt substitute and food seasonings). Avoid salty foods and drinks. Read labels on food containers before buying. Eating well and exercising can help control high blood pressure. STROKE  Stroke is a medical emergency. Stroke may be the result of a blood clot in a blood vessel in the brain or by a brain hemorrhage (bleeding). Know the signs and symptoms of a stroke. To lower the risk of developing a stroke:  Avoid fatty foods.  Quit smoking.  Control your diabetes, blood pressure, and irregular heart rate. THROMBOPHLEBITIS  (BLOOD CLOT) OF THE LEG  Becoming overweight and leading a stationary lifestyle may also contribute to developing blood clots. Controlling your diet and exercising will help lower the risk of developing blood clots. CANCER SCREENING  Breast Cancer: Take steps to reduce your risk of breast cancer.  You should practice "breast self-awareness." This means understanding the normal appearance and feel of your breasts and should include breast self-examination. Any changes detected, no matter how small, should be reported to your caregiver.  After age 40, you should have a clinical breast exam (CBE) every year.  Starting at age 40, you should consider having a mammogram (breast X-ray) every year.  If you have a family history of breast cancer, talk to your caregiver about genetic screening.  If you are at high risk for breast cancer, talk to your caregiver about having an MRI and a mammogram every year.  Intestinal or Stomach Cancer: Tests to consider are a rectal exam, fecal occult blood, sigmoidoscopy, and colonoscopy. Women who are high risk may need to be screened at an earlier age and more often.  Cervical Cancer:  Beginning at age 30, you should have a Pap test every 3 years as long as the past 3 Pap tests have been normal.  If you have had past treatment for cervical cancer or a condition that could lead to cancer, you need Pap tests and screening for cancer for at least 20 years after your treatment.  If you had a hysterectomy for a problem that was not cancer or a condition that could lead to cancer, then you no longer need Pap tests.    If you are between ages 65 and 70, and you have had normal Pap tests going back 10 years, you no longer need Pap tests.  If Pap tests have been discontinued, risk factors (such as a new sexual partner) need to be reassessed to determine if screening should be resumed.  Some medical problems can increase the chance of getting cervical cancer. In these  cases, your caregiver may recommend more frequent screening and Pap tests.  Uterine Cancer: If you have vaginal bleeding after reaching menopause, you should notify your caregiver.  Ovarian cancer: Other than yearly pelvic exams, there are no reliable tests available to screen for ovarian cancer at this time except for yearly pelvic exams.  Lung Cancer: Yearly chest X-rays can detect lung cancer and should be done on high risk women, such as cigarette smokers and women with chronic lung disease (emphysema).  Skin Cancer: A complete body skin exam should be done at your yearly examination. Avoid overexposure to the sun and ultraviolet light lamps. Use a strong sun block cream when in the sun. All of these things are important in lowering the risk of skin cancer. MENOPAUSE Menopause Symptoms: Hormone therapy products are effective for treating symptoms associated with menopause:  Moderate to severe hot flashes.  Night sweats.  Mood swings.  Headaches.  Tiredness.  Loss of sex drive.  Insomnia.  Other symptoms. Hormone replacement carries certain risks, especially in older women. Women who use or are thinking about using estrogen or estrogen with progestin treatments should discuss that with their caregiver. Your caregiver will help you understand the benefits and risks. The ideal dose of hormone replacement therapy is not known. The Food and Drug Administration (FDA) has concluded that hormone therapy should be used only at the lowest doses and for the shortest amount of time to reach treatment goals.  OSTEOPOROSIS Protecting Against Bone Loss and Preventing Fracture: If you use hormone therapy for prevention of bone loss (osteoporosis), the risks for bone loss must outweigh the risk of the therapy. Ask your caregiver about other medications known to be safe and effective for preventing bone loss and fractures. To guard against bone loss or fractures, the following is recommended:  If  you are less than age 50, take 1000 mg of calcium and at least 600 mg of Vitamin D per day.  If you are greater than age 50 but less than age 70, take 1200 mg of calcium and at least 600 mg of Vitamin D per day.  If you are greater than age 70, take 1200 mg of calcium and at least 800 mg of Vitamin D per day. Smoking and excessive alcohol intake increases the risk of osteoporosis. Eat foods rich in calcium and vitamin D and do weight bearing exercises several times a week as your caregiver suggests. DIABETES Diabetes Melitus: If you have Type I or Type 2 diabetes, you should keep your blood sugar under control with diet, exercise and recommended medication. Avoid too many sweets, starchy and fatty foods. Being overweight can make control more difficult. COGNITION AND MEMORY Cognition and Memory: Menopausal hormone therapy is not recommended for the prevention of cognitive disorders such as Alzheimer's disease or memory loss.  DEPRESSION  Depression may occur at any age, but is common in elderly women. The reasons may be because of physical, medical, social (loneliness), or financial problems and needs. If you are experiencing depression because of medical problems and control of symptoms, talk to your caregiver about this. Physical activity and   exercise may help with mood and sleep. Community and volunteer involvement may help your sense of value and worth. If you have depression and you feel that the problem is getting worse or becoming severe, talk to your caregiver about treatment options that are best for you. ACCIDENTS  Accidents are common and can be serious in the elderly woman. Prepare your house to prevent accidents. Eliminate throw rugs, place hand bars in the bath, shower and toilet areas. Avoid wearing high heeled shoes or walking on wet, snowy, and icy areas. Limit or stop driving if you have vision or hearing problems, or you feel you are unsteady with you movements and  reflexes. HEPATITIS C Hepatitis C is a type of viral infection affecting the liver. It is spread mainly through contact with blood from an infected person. It can be treated, but if left untreated, it can lead to severe liver damage over years. Many people who are infected do not know that the virus is in their blood. If you are a "baby-boomer", it is recommended that you have one screening test for Hepatitis C. IMMUNIZATIONS  Several immunizations are important to consider having during your senior years, including:   Tetanus, diptheria, and pertussis booster shot.  Influenza every year before the flu season begins.  Pneumonia vaccine.  Shingles vaccine.  Others as indicated based on your specific needs. Talk to your caregiver about these. Document Released: 12/29/2005 Document Revised: 10/23/2012 Document Reviewed: 08/24/2008 [employer] Patient Information 2014 [employer].  

## 2014-04-09 NOTE — Progress Notes (Signed)
Amanda Barber 1961-10-04 [id number]    History:    Presents for annual exam.  Postmenopausal with no bleeding/vasectomy. Overdue for mammogram, history of normal Paps and mammograms. Has not had a colonoscopy. Normal bone density at primary care. Vitamin D 66, CBC, CMET all normal at primary care. Has had some issues with anxiety and questionable ADD, prefers no medication.   Past medical history, past surgical history, family history and social history were all reviewed and documented in the EPIC chart. Part-time Metallurgist. Children 13 and 15 both doing well. Mother hypertension.  ROS:  A  12 point ROS was performed and pertinent positives and negatives are included.  Exam:  Filed Vitals:   04/09/14 1153  BP: 120/70    General appearance:  Normal Thyroid:  Symmetrical, normal in size, without palpable masses or nodularity. Respiratory  Auscultation:  Clear without wheezing or rhonchi Cardiovascular  Auscultation:  Regular rate, without rubs, murmurs or gallops  Edema/varicosities:  Not grossly evident Abdominal  Soft,nontender, without masses, guarding or rebound.  Liver/spleen:  No organomegaly noted  Hernia:  None appreciated  Skin  Inspection:  Grossly normal   Breasts: Examined lying and sitting.     Right: Without masses, retractions, discharge or axillary adenopathy.     Left: Without masses, retractions, discharge or axillary adenopathy. Gentitourinary   Inguinal/mons:  Normal without inguinal adenopathy  External genitalia:  Normal  BUS/Urethra/Skene's glands:  Normal  Vagina:  Normal  Cervix:  Normal  Uterus:   normal in size, shape and contour.  Midline and mobile  Adnexa/parametria:     Rt: Without masses or tenderness.   Lt: Without masses or tenderness.  Anus and perineum: Normal  Digital rectal exam: Normal sphincter tone without palpated masses or tenderness  Assessment/Plan:  53 y.o. MWF G2P2 for annual exam.    Postmenopausal/no bleeding/no  HRT Vasectomy Anxiety/questionable ADD Abdominal bloating for several years Labs and DEXA primary care  Plan: SBE's, reviewed importance at annual screening mammogram, instructed to schedule. Counseling reviewed and encouraged.  Screening colonoscopy encouraged, instructed to schedule, especially since continued problem of bloating, negative sonohysterogram 2014.. Regular exercise, calcium rich diet, vitamin D 2000 daily encouraged. Pap, Pap normal 2012, new screening guidelines reviewed.  Note: This dictation was prepared with Dragon/digital dictation.  Any transcriptional errors that result are unintentional. Huel Cote [facility], 1:05 PM 04/09/2014

## 2014-04-10 ENCOUNTER — Telehealth: Payer: Self-pay

## 2014-04-10 LAB — URINALYSIS W MICROSCOPIC + REFLEX CULTURE
Bacteria, UA: NONE SEEN
Bilirubin Urine: NEGATIVE
Casts: NONE SEEN
Crystals: NONE SEEN
Glucose, UA: NEGATIVE mg/dL
HGB URINE DIPSTICK: NEGATIVE
Ketones, ur: NEGATIVE mg/dL
LEUKOCYTES UA: NEGATIVE
NITRITE: NEGATIVE
PROTEIN: NEGATIVE mg/dL
Specific Gravity, Urine: 1.008 (ref 1.005–1.030)
Urobilinogen, UA: 0.2 mg/dL (ref 0.0–1.0)
pH: 7 (ref 5.0–8.0)

## 2014-04-10 NOTE — Telephone Encounter (Signed)
Patient called to inquire regarding urinalysis result from yesterday. Patient informed u/a was negative/normal.

## 2014-04-23 ENCOUNTER — Other Ambulatory Visit: Payer: Self-pay | Admitting: Gastroenterology

## 2014-04-23 DIAGNOSIS — R140 Abdominal distension (gaseous): Secondary | ICD-10-CM

## 2014-04-29 ENCOUNTER — Other Ambulatory Visit: Payer: No Typology Code available for payment source

## 2014-04-30 ENCOUNTER — Ambulatory Visit
Admission: RE | Admit: 2014-04-30 | Discharge: 2014-04-30 | Disposition: A | Discharge status: Home / Self Care | Payer: No Typology Code available for payment source | Source: Ambulatory Visit | Attending: Gastroenterology | Admitting: Gastroenterology

## 2014-04-30 DIAGNOSIS — R140 Abdominal distension (gaseous): Secondary | ICD-10-CM

## 2014-05-07 ENCOUNTER — Other Ambulatory Visit: Payer: Self-pay | Admitting: Gastroenterology

## 2014-05-07 DIAGNOSIS — D1800 Hemangioma unspecified site: Secondary | ICD-10-CM

## 2014-05-13 ENCOUNTER — Other Ambulatory Visit: Payer: No Typology Code available for payment source

## 2014-05-27 ENCOUNTER — Other Ambulatory Visit: Payer: No Typology Code available for payment source

## 2014-09-14 ENCOUNTER — Other Ambulatory Visit: Payer: Self-pay | Admitting: Internal Medicine

## 2014-09-21 ENCOUNTER — Encounter: Payer: Self-pay | Admitting: Women's Health

## 2014-09-24 ENCOUNTER — Ambulatory Visit: Payer: No Typology Code available for payment source | Admitting: Internal Medicine

## 2014-10-20 ENCOUNTER — Telehealth: Payer: Self-pay | Admitting: *Deleted

## 2014-10-20 NOTE — Telephone Encounter (Signed)
Pt asked what date was her recent blood work drawn feb 2015 was told to pt.

## 2014-10-20 NOTE — Telephone Encounter (Signed)
Pt called requesting labs drawn? I left for pt to call.

## 2015-01-26 ENCOUNTER — Ambulatory Visit (INDEPENDENT_AMBULATORY_CARE_PROVIDER_SITE_OTHER): Payer: BLUE CROSS/BLUE SHIELD | Admitting: Internal Medicine

## 2015-01-26 ENCOUNTER — Encounter: Payer: Self-pay | Admitting: Internal Medicine

## 2015-01-26 ENCOUNTER — Other Ambulatory Visit (INDEPENDENT_AMBULATORY_CARE_PROVIDER_SITE_OTHER): Payer: BLUE CROSS/BLUE SHIELD

## 2015-01-26 VITALS — BP 124/78 | HR 91 | Temp 98.1°F | Resp 16 | Ht 67.0 in | Wt 182.0 lb

## 2015-01-26 DIAGNOSIS — Z0000 Encounter for general adult medical examination without abnormal findings: Secondary | ICD-10-CM

## 2015-01-26 DIAGNOSIS — R635 Abnormal weight gain: Secondary | ICD-10-CM

## 2015-01-26 DIAGNOSIS — Z1211 Encounter for screening for malignant neoplasm of colon: Secondary | ICD-10-CM

## 2015-01-26 DIAGNOSIS — R140 Abdominal distension (gaseous): Secondary | ICD-10-CM

## 2015-01-26 LAB — URINALYSIS, ROUTINE W REFLEX MICROSCOPIC
Bilirubin Urine: NEGATIVE
Ketones, ur: NEGATIVE
Nitrite: NEGATIVE
Specific Gravity, Urine: 1.01 (ref 1.000–1.030)
Total Protein, Urine: NEGATIVE
UROBILINOGEN UA: 0.2 (ref 0.0–1.0)
Urine Glucose: NEGATIVE
pH: 6 (ref 5.0–8.0)

## 2015-01-26 LAB — COMPREHENSIVE METABOLIC PANEL WITH GFR
ALT: 15 U/L (ref 0–35)
AST: 16 U/L (ref 0–37)
Albumin: 4.3 g/dL (ref 3.5–5.2)
Alkaline Phosphatase: 96 U/L (ref 39–117)
BUN: 14 mg/dL (ref 6–23)
CO2: 34 meq/L — ABNORMAL HIGH (ref 19–32)
Calcium: 9.5 mg/dL (ref 8.4–10.5)
Chloride: 102 meq/L (ref 96–112)
Creatinine, Ser: 0.76 mg/dL (ref 0.40–1.20)
GFR: 84.2 mL/min
Glucose, Bld: 95 mg/dL (ref 70–99)
Potassium: 4.1 meq/L (ref 3.5–5.1)
Sodium: 140 meq/L (ref 135–145)
Total Bilirubin: 0.7 mg/dL (ref 0.2–1.2)
Total Protein: 7.1 g/dL (ref 6.0–8.3)

## 2015-01-26 LAB — HEMOGLOBIN A1C: Hgb A1c MFr Bld: 5.3 % (ref 4.6–6.5)

## 2015-01-26 LAB — LIPID PANEL
Cholesterol: 196 mg/dL (ref 0–200)
HDL: 69.9 mg/dL
LDL Cholesterol: 106 mg/dL — ABNORMAL HIGH (ref 0–99)
NonHDL: 126.1
Total CHOL/HDL Ratio: 3
Triglycerides: 101 mg/dL (ref 0.0–149.0)
VLDL: 20.2 mg/dL (ref 0.0–40.0)

## 2015-01-26 LAB — TSH: TSH: 1.43 u[IU]/mL (ref 0.35–4.50)

## 2015-01-26 NOTE — Progress Notes (Signed)
Pre visit review using our clinic review tool, if applicable. No additional management support is needed unless otherwise documented below in the visit note. 

## 2015-01-26 NOTE — Patient Instructions (Addendum)
We will check the blood work today to see if the thyroid is having any problems. We will call you back about the results.   If you can find the name of the place that tested you for ADHD that would be helpful for Dr. Felicity Coyer so we can get the records.   A good amount of exercise when you are trying to lose weight 5-6 times per week for 45-60 minutes.   If you decide to continue coming here for your care please have your records transferred from [place].    Health Maintenance Adopting a healthy lifestyle and getting preventive care can go a long way to promote health and wellness. Talk with your health care provider about what schedule of regular examinations is right for you. This is a good chance for you to check in with your provider about disease prevention and staying healthy. In between checkups, there are plenty of things you can do on your own. Experts have done a lot of research about which lifestyle changes and preventive measures are most likely to keep you healthy. Ask your health care provider for more information. WEIGHT AND DIET  Eat a healthy diet  Be sure to include plenty of vegetables, fruits, low-fat dairy products, and lean protein.  Do not eat a lot of foods high in solid fats, added sugars, or salt.  Get regular exercise. This is one of the most important things you can do for your health.  Most adults should exercise for at least 150 minutes each week. The exercise should increase your heart rate and make you sweat (moderate-intensity exercise).  Most adults should also do strengthening exercises at least twice a week. This is in addition to the moderate-intensity exercise.  Maintain a healthy weight  Body mass index (BMI) is a measurement that can be used to identify possible weight problems. It estimates body fat based on height and weight. Your health care provider can help determine your BMI and help you achieve or maintain a healthy weight.  For females 53  years of age and older:   A BMI below 18.5 is considered underweight.  A BMI of 18.5 to 24.9 is normal.  A BMI of 25 to 29.9 is considered overweight.  A BMI of 30 and above is considered obese.  Watch levels of cholesterol and blood lipids  You should start having your blood tested for lipids and cholesterol at 55 years of age, then have this test every 5 years.  You may need to have your cholesterol levels checked more often if:  Your lipid or cholesterol levels are high.  You are older than 55 years of age.  You are at high risk for heart disease.  CANCER SCREENING   Lung Cancer  Lung cancer screening is recommended for adults 34-60 years old who are at high risk for lung cancer because of a history of smoking.  A yearly low-dose CT scan of the lungs is recommended for people who:  Currently smoke.  Have quit within the past 15 years.  Have at least a 30-pack-year history of smoking. A pack year is smoking an average of one pack of cigarettes a day for 1 year.  Yearly screening should continue until it has been 15 years since you quit.  Yearly screening should stop if you develop a health problem that would prevent you from having lung cancer treatment.  Breast Cancer  Practice breast self-awareness. This means understanding how your breasts normally appear and feel.  It also means doing regular breast self-exams. Let your health care provider know about any changes, no matter how small.  If you are in your 20s or 30s, you should have a clinical breast exam (CBE) by a health care provider every 1-3 years as part of a regular health exam.  If you are 46 or older, have a CBE every year. Also consider having a breast X-ray (mammogram) every year.  If you have a family history of breast cancer, talk to your health care provider about genetic screening.  If you are at high risk for breast cancer, talk to your health care provider about having an MRI and a mammogram  every year.  Breast cancer gene (BRCA) assessment is recommended for women who have family members with BRCA-related cancers. BRCA-related cancers include:  Breast.  Ovarian.  Tubal.  Peritoneal cancers.  Results of the assessment will determine the need for genetic counseling and BRCA1 and BRCA2 testing. Cervical Cancer Routine pelvic examinations to screen for cervical cancer are no longer recommended for nonpregnant women who are considered low risk for cancer of the pelvic organs (ovaries, uterus, and vagina) and who do not have symptoms. A pelvic examination may be necessary if you have symptoms including those associated with pelvic infections. Ask your health care provider if a screening pelvic exam is right for you.   The Pap test is the screening test for cervical cancer for women who are considered at risk.  If you had a hysterectomy for a problem that was not cancer or a condition that could lead to cancer, then you no longer need Pap tests.  If you are older than 65 years, and you have had normal Pap tests for the past 10 years, you no longer need to have Pap tests.  If you have had past treatment for cervical cancer or a condition that could lead to cancer, you need Pap tests and screening for cancer for at least 20 years after your treatment.  If you no longer get a Pap test, assess your risk factors if they change (such as having a new sexual partner). This can affect whether you should start being screened again.  Some women have medical problems that increase their chance of getting cervical cancer. If this is the case for you, your health care provider may recommend more frequent screening and Pap tests.  The human papillomavirus (HPV) test is another test that may be used for cervical cancer screening. The HPV test looks for the virus that can cause cell changes in the cervix. The cells collected during the Pap test can be tested for HPV.  The HPV test can be used to  screen women 73 years of age and older. Getting tested for HPV can extend the interval between normal Pap tests from three to five years.  An HPV test also should be used to screen women of any age who have unclear Pap test results.  After 55 years of age, women should have HPV testing as often as Pap tests.  Colorectal Cancer  This type of cancer can be detected and often prevented.  Routine colorectal cancer screening usually begins at 55 years of age and continues through 55 years of age.  Your health care provider may recommend screening at an earlier age if you have risk factors for colon cancer.  Your health care provider may also recommend using home test kits to check for hidden blood in the stool.  A small camera at the  end of a tube can be used to examine your colon directly (sigmoidoscopy or colonoscopy). This is done to check for the earliest forms of colorectal cancer.  Routine screening usually begins at age 22.  Direct examination of the colon should be repeated every 5-10 years through 55 years of age. However, you may need to be screened more often if early forms of precancerous polyps or small growths are found. Skin Cancer  Check your skin from head to toe regularly.  Tell your health care provider about any new moles or changes in moles, especially if there is a change in a mole's shape or color.  Also tell your health care provider if you have a mole that is larger than the size of a pencil eraser.  Always use sunscreen. Apply sunscreen liberally and repeatedly throughout the day.  Protect yourself by wearing long sleeves, pants, a wide-brimmed hat, and sunglasses whenever you are outside. HEART DISEASE, DIABETES, AND HIGH BLOOD PRESSURE   Have your blood pressure checked at least every 1-2 years. High blood pressure causes heart disease and increases the risk of stroke.  If you are between 69 years and 50 years old, ask your health care provider if you should  take aspirin to prevent strokes.  Have regular diabetes screenings. This involves taking a blood sample to check your fasting blood sugar level.  If you are at a normal weight and have a low risk for diabetes, have this test once every three years after 55 years of age.  If you are overweight and have a high risk for diabetes, consider being tested at a younger age or more often. PREVENTING INFECTION  Hepatitis B  If you have a higher risk for hepatitis B, you should be screened for this virus. You are considered at high risk for hepatitis B if:  You were born in a country where hepatitis B is common. Ask your health care provider which countries are considered high risk.  Your parents were born in a high-risk country, and you have not been immunized against hepatitis B (hepatitis B vaccine).  You have HIV or AIDS.  You use needles to inject street drugs.  You live with someone who has hepatitis B.  You have had sex with someone who has hepatitis B.  You get hemodialysis treatment.  You take certain medicines for conditions, including cancer, organ transplantation, and autoimmune conditions. Hepatitis C  Blood testing is recommended for:  Everyone born from [birth year] through 1966.  Anyone with known risk factors for hepatitis C. Sexually transmitted infections (STIs)  You should be screened for sexually transmitted infections (STIs) including gonorrhea and chlamydia if:  You are sexually active and are younger than 55 years of age.  You are older than 55 years of age and your health care provider tells you that you are at risk for this type of infection.  Your sexual activity has changed since you were last screened and you are at an increased risk for chlamydia or gonorrhea. Ask your health care provider if you are at risk.  If you do not have HIV, but are at risk, it may be recommended that you take a prescription medicine daily to prevent HIV infection. This is called  pre-exposure prophylaxis (PrEP). You are considered at risk if:  You are sexually active and do not regularly use condoms or know the HIV status of your partner(s).  You take drugs by injection.  You are sexually active with a partner who has  HIV. Talk with your health care provider about whether you are at high risk of being infected with HIV. If you choose to begin PrEP, you should first be tested for HIV. You should then be tested every 3 months for as long as you are taking PrEP.  PREGNANCY   If you are premenopausal and you may become pregnant, ask your health care provider about preconception counseling.  If you may become pregnant, take 400 to 800 micrograms (mcg) of folic acid every day.  If you want to prevent pregnancy, talk to your health care provider about birth control (contraception). OSTEOPOROSIS AND MENOPAUSE   Osteoporosis is a disease in which the bones lose minerals and strength with aging. This can result in serious bone fractures. Your risk for osteoporosis can be identified using a bone density scan.  If you are 51 years of age or older, or if you are at risk for osteoporosis and fractures, ask your health care provider if you should be screened.  Ask your health care provider whether you should take a calcium or vitamin D supplement to lower your risk for osteoporosis.  Menopause may have certain physical symptoms and risks.  Hormone replacement therapy may reduce some of these symptoms and risks. Talk to your health care provider about whether hormone replacement therapy is right for you.  HOME CARE INSTRUCTIONS   Schedule regular health, dental, and eye exams.  Stay current with your immunizations.   Do not use any tobacco products including cigarettes, chewing tobacco, or electronic cigarettes.  If you are pregnant, do not drink alcohol.  If you are breastfeeding, limit how much and how often you drink alcohol.  Limit alcohol intake to no more than 1  drink per day for nonpregnant women. One drink equals 12 ounces of beer, 5 ounces of wine, or 1 ounces of hard liquor.  Do not use street drugs.  Do not share needles.  Ask your health care provider for help if you need support or information about quitting drugs.  Tell your health care provider if you often feel depressed.  Tell your health care provider if you have ever been abused or do not feel safe at home. Document Released: 05/22/2011 Document Revised: 03/23/2014 Document Reviewed: 10/08/2013 [facility] Patient Information 2015 [place], [place]. This information is not intended to replace advice given to you by your health care provider. Make sure you discuss any questions you have with your health care provider.

## 2015-01-27 ENCOUNTER — Encounter: Payer: Self-pay | Admitting: Internal Medicine

## 2015-01-27 DIAGNOSIS — Z0000 Encounter for general adult medical examination without abnormal findings: Secondary | ICD-10-CM | POA: Insufficient documentation

## 2015-01-27 NOTE — Progress Notes (Signed)
   Subjective:    Patient ID: Amanda Barber, female    DOB: 1960/12/30, 54 y.o.   MRN: [id number]  HPI The patient is a 54 YO female coming in for wellness. She had been seeing Dr. Asa Lente in the past and now has been seeing someone at [facility] and is not sure where she wants to go but wants to get her physical done today. She has been using voltaren gel for arthritis since last she was here. She is still having mild hot flashes but they are getting better overall. Not on hormonal therapy. She denies new complaints or problems since last visit.   PMH, [facility], social history reviewed and updated at today's visit.   Review of Systems  Constitutional: Negative for fever, activity change, appetite change, fatigue and unexpected weight change.  HENT: Negative.   Eyes: Negative.   Respiratory: Negative for cough, chest tightness, shortness of breath and wheezing.   Cardiovascular: Negative for chest pain, palpitations and leg swelling.  Gastrointestinal: Negative for nausea, abdominal pain, diarrhea, constipation and abdominal distention.  Musculoskeletal: Positive for arthralgias. Negative for myalgias, back pain, joint swelling and gait problem.       Uses voltaren gel for pain  Skin: Negative.   Neurological: Negative.   Psychiatric/Behavioral: Negative.       Objective:   Physical Exam  Constitutional: She is oriented to person, place, and time. She appears well-developed and well-nourished.  HENT:  Head: Normocephalic and atraumatic.  Eyes: EOM are normal.  Neck: Normal range of motion.  Cardiovascular: Normal rate and regular rhythm.   Carotids without bruit  Pulmonary/Chest: Effort normal and breath sounds normal. No respiratory distress. She has no wheezes. She has no rales.  Abdominal: Soft. Bowel sounds are normal.  Musculoskeletal: She exhibits no edema.  Neurological: She is alert and oriented to person, place, and time. Coordination normal.  Skin: Skin is warm and dry.   Filed  Vitals:   01/26/15 1303  BP: 124/78  Pulse: 91  Temp: 98.1 F (36.7 C)  TempSrc: Oral  Resp: 16  Height: 5\' 7"  (1.702 m)  Weight: 182 lb (82.555 kg)  SpO2: 96%      Assessment & Plan:

## 2015-01-27 NOTE — Assessment & Plan Note (Signed)
She is still having this problem and talked to her about the importance of colon cancer screening and explained that to her. She agrees to get this done (although is she admits she lacks follow through on appointments). Have placed referral to GI.

## 2015-01-27 NOTE — Assessment & Plan Note (Signed)
Patient did not want any immunizations today as she may have had them at [facility]. Informed her that she needs to decide where she is going to get her care in order to not duplicate studies and imaging. After she decides she needs to get records sent from whichever one she is not seeing to the other. Referral for GI as she is still having bloating symptoms and has never been screened. Up to date on pap smear. Talked with her about colon health and colon cancer screening (including etiology of colon cancer and the colonoscopy, risk/benefits).

## 2015-08-27 IMAGING — US US ABDOMEN COMPLETE
1 series · 13 of 25 positions shown · non-contrast
Comparison: None.

CLINICAL DATA: Abdominal pain and bloating.

EXAM:
ULTRASOUND ABDOMEN COMPLETE

[Series 1: us abdomen complete · 0.37mm/px · 13 of 94 slices shown]
[im 1/94]
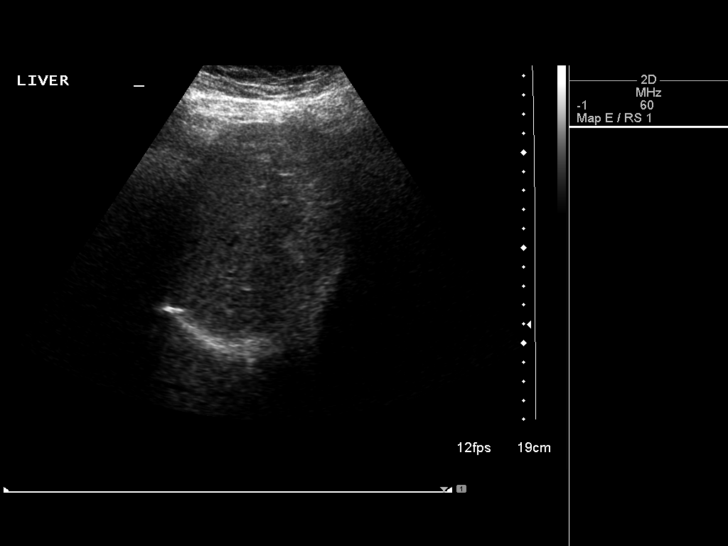
[im 8/94]
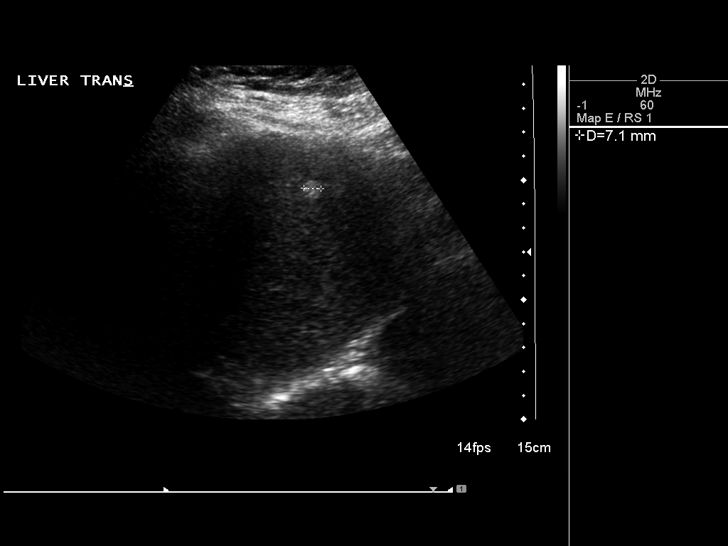
[im 16/94]
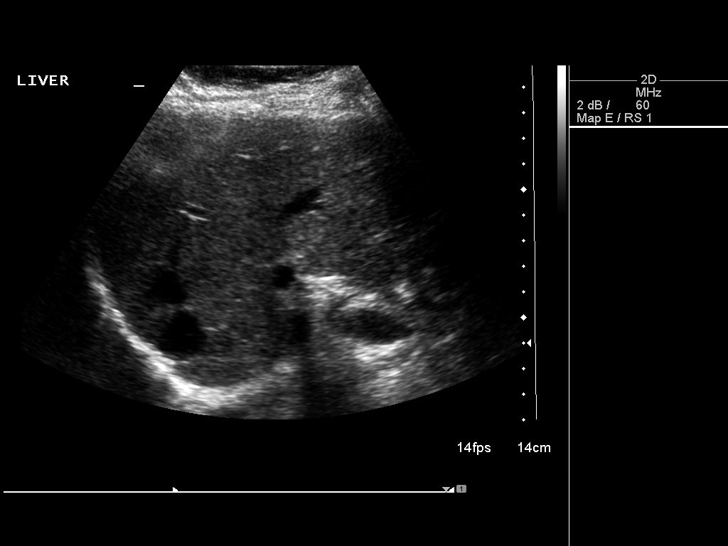
[im 24/94]
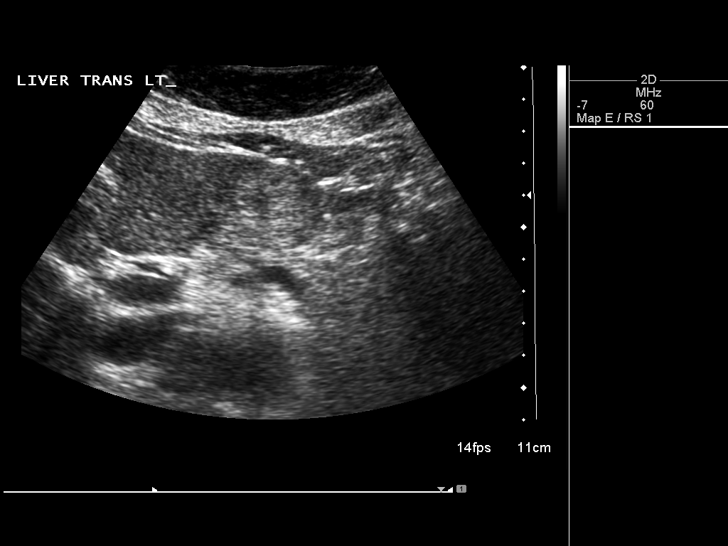
[im 32/94]
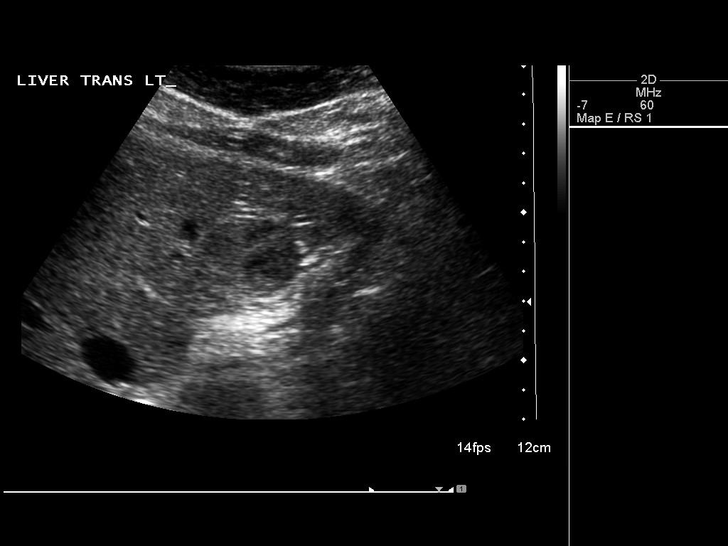
[im 39/94]
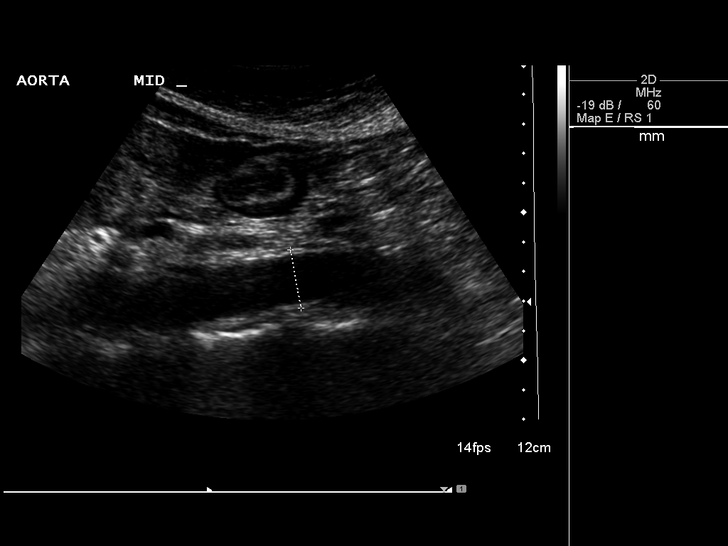
[im 47/94]
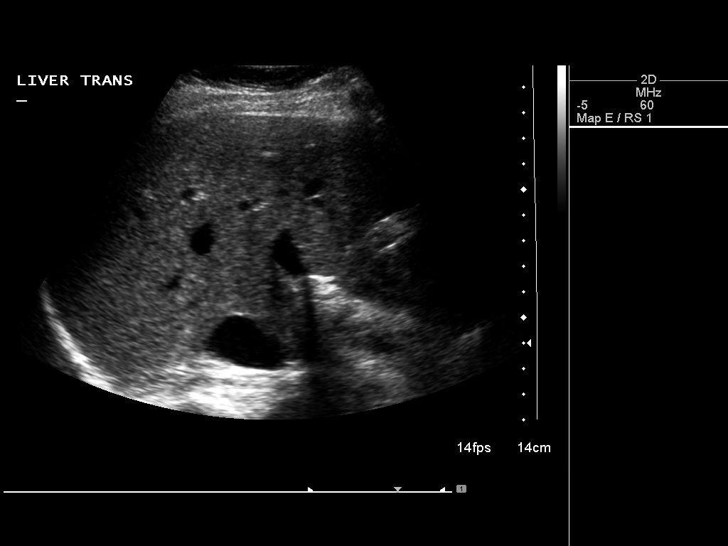
[im 55/94]
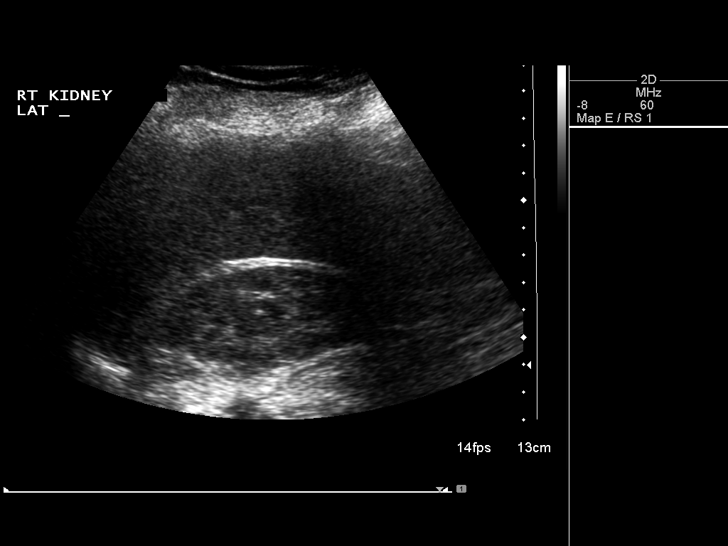
[im 63/94]
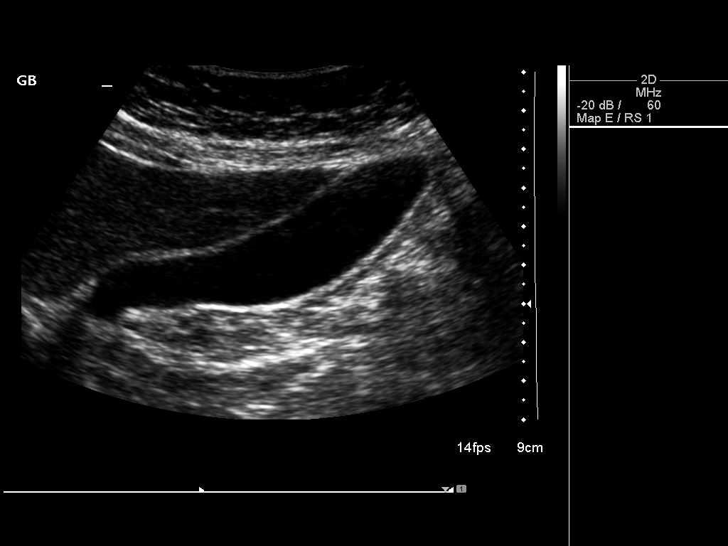
[im 70/94]
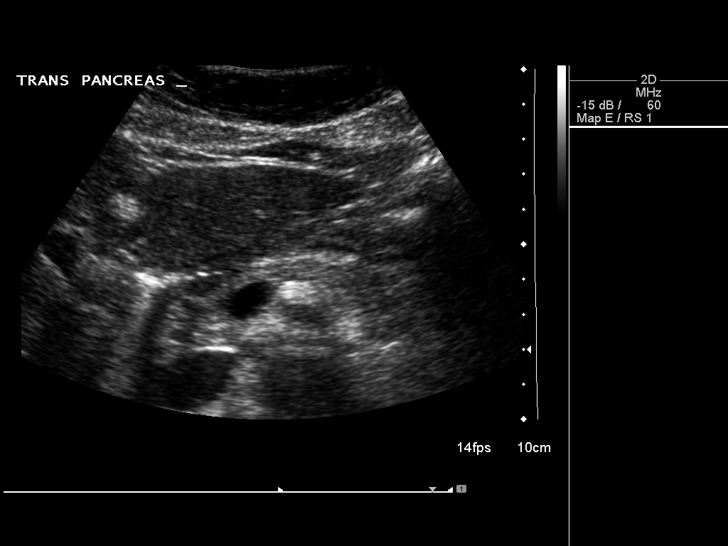
[im 78/94]
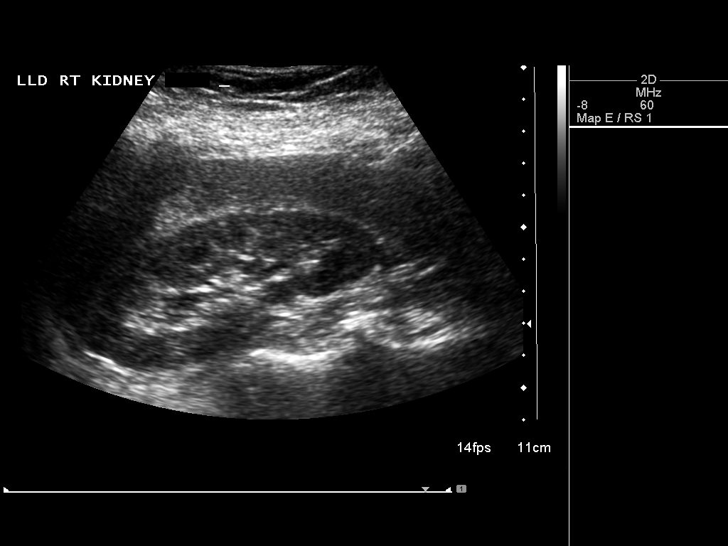
[im 86/94]
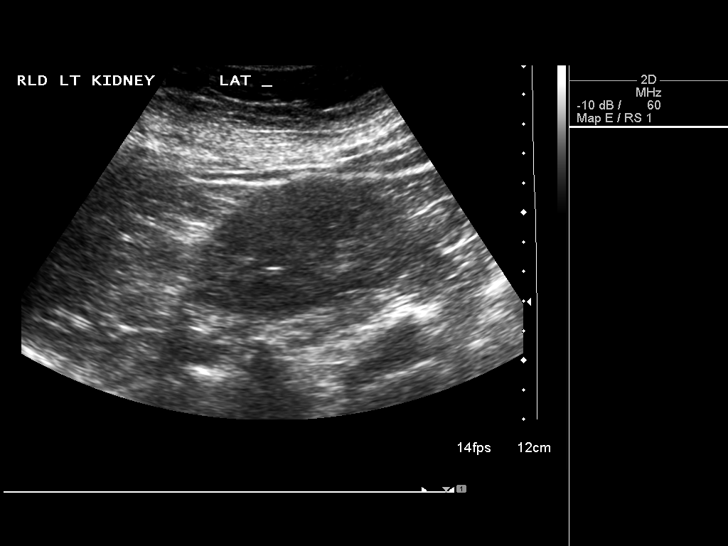
[im 94/94]
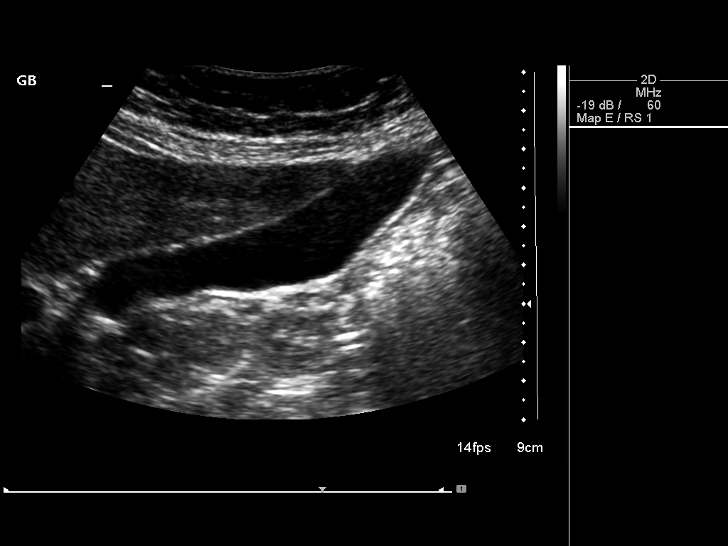

[13 of 25 positions shown; findings below may reference images not displayed]

FINDINGS: Gallbladder:

No gallstones or wall thickening visualized. No sonographic Murphy
sign noted.

Common bile duct:

Diameter:

Liver:

There are multiple hyperechoic liver lesions. 2 adjacent lesions in
the left lobe are the largest lesions. The more mixed echogenic
lesion measures 3.7 x 2.7 x 3.6 cm and a more hyperechoic lesion
measures 2.8 x 2.3 x 2.7 cm. Although these could represent multiple
benign hemangiomas I could not exclude the possibility of metastatic
disease. Recommend MRI abdomen without and with contrast for further
evaluation.

IVC:

Normal caliber.

Pancreas:

Sonographically unremarkable.

Spleen:

Normal size and echogenicity without focal lesions.

Right Kidney:

Length: 10.2 cm. Normal renal cortical thickness and echogenicity
without focal lesions or hydronephrosis.

Left Kidney:

Length: 10.1 cm. Normal renal cortical thickness and echogenicity
without focal lesions or hydronephrosis.

Abdominal aorta:

Normal caliber.

Other findings:

None.
IMPRESSION: Multiple liver lesions. These could reflect multiple benign
hemangiomas but further evaluation with MRI is recommended (MRI
abdomen without and with contrast).

Normal gallbladder and normal caliber common bile duct.

Normal sonographic appearance of the spleen, pancreas and kidneys.

## 2016-02-14 ENCOUNTER — Encounter: Payer: No Typology Code available for payment source | Admitting: Women's Health

## 2016-03-14 DIAGNOSIS — L400 Psoriasis vulgaris: Secondary | ICD-10-CM | POA: Diagnosis not present

## 2016-03-14 DIAGNOSIS — L908 Other atrophic disorders of skin: Secondary | ICD-10-CM | POA: Diagnosis not present

## 2016-03-14 DIAGNOSIS — L304 Erythema intertrigo: Secondary | ICD-10-CM | POA: Diagnosis not present

## 2016-03-14 DIAGNOSIS — L821 Other seborrheic keratosis: Secondary | ICD-10-CM | POA: Diagnosis not present

## 2016-09-07 DIAGNOSIS — M7502 Adhesive capsulitis of left shoulder: Secondary | ICD-10-CM | POA: Diagnosis not present

## 2016-11-24 DIAGNOSIS — H5212 Myopia, left eye: Secondary | ICD-10-CM | POA: Diagnosis not present

## 2017-04-03 DIAGNOSIS — F4323 Adjustment disorder with mixed anxiety and depressed mood: Secondary | ICD-10-CM | POA: Diagnosis not present

## 2017-04-04 ENCOUNTER — Encounter: Payer: Self-pay | Admitting: Gynecology

## 2017-04-12 ENCOUNTER — Telehealth: Payer: Self-pay | Admitting: Rheumatology

## 2017-04-12 NOTE — Telephone Encounter (Signed)
Sent in error

## 2017-05-11 DIAGNOSIS — M2550 Pain in unspecified joint: Secondary | ICD-10-CM | POA: Insufficient documentation

## 2017-05-11 DIAGNOSIS — R5383 Other fatigue: Secondary | ICD-10-CM | POA: Insufficient documentation

## 2017-05-11 DIAGNOSIS — M5136 Other intervertebral disc degeneration, lumbar region: Secondary | ICD-10-CM | POA: Insufficient documentation

## 2017-05-11 DIAGNOSIS — L409 Psoriasis, unspecified: Secondary | ICD-10-CM | POA: Insufficient documentation

## 2017-05-11 DIAGNOSIS — R768 Other specified abnormal immunological findings in serum: Secondary | ICD-10-CM | POA: Insufficient documentation

## 2017-05-11 DIAGNOSIS — M5030 Other cervical disc degeneration, unspecified cervical region: Secondary | ICD-10-CM | POA: Insufficient documentation

## 2017-05-11 DIAGNOSIS — M19042 Primary osteoarthritis, left hand: Secondary | ICD-10-CM

## 2017-05-11 DIAGNOSIS — M19041 Primary osteoarthritis, right hand: Secondary | ICD-10-CM | POA: Insufficient documentation

## 2017-05-11 NOTE — Progress Notes (Addendum)
Office Visit Note  Patient: Amanda Barber             Date of Birth: Nov 24, 1960           MRN: [id number]             PCP: Carol Ada, MD Referring: Carol Ada, MD Visit Date: 05/16/2017 Occupation: '@GUAROCC'$ @    Subjective:  Pain of the Neck; Pain of the Right Hand; Pain of the Right Ankle; Pain of the Left Ankle; and New Patient (Initial Visit) (was evaluated by Dr Trudie Reed in the past has also seen Dr Estanislado Pandy 2014 )   History of Present Illness: Amanda Barber is a 56 y.o. female with history of osteoarthritis. She returns today after last visit in 2014. At the time she was evaluated for arthralgias. She had negative serology and negative ultrasound. She also had history of plantar fasciitis in the past with no recurrence. She had seen Dr. Trudie Reed since then twice and her workup there was negative as well. She states recently she's been having increased pain and swelling in her hands which she describes over her PIP joints and CMC joints. She continues to have neck and lower back pain. She also has some discomfort in her ankles and feet without any joint swelling. She has long-standing history of psoriasis. She states she has occasional spots which respond to topical therapy and she's been followed by dermatologist. She recalls having cortisone injection to the left shoulder joint last year by an orthopedic surgeon for shoulder joint tendinopathy. She has been more active and using treadmill on a regular basis.  Activities of Daily Living:  Patient reports morning stiffness for 0 minute.   Patient Denies nocturnal pain.  Difficulty dressing/grooming: Denies Difficulty climbing stairs: Denies Difficulty getting out of chair: Denies Difficulty using hands for taps, buttons, cutlery, and/or writing: Reports   Review of Systems  Constitutional: Negative for fatigue, night sweats, weight gain, weight loss and weakness.  HENT: Negative for mouth sores, trouble swallowing, trouble  swallowing, mouth dryness and nose dryness.   Eyes: Positive for dryness. Negative for pain, redness and visual disturbance.  Respiratory: Negative for cough, shortness of breath and difficulty breathing.   Cardiovascular: Negative for chest pain, palpitations, hypertension, irregular heartbeat and swelling in legs/feet.  Gastrointestinal: Negative for blood in stool, constipation and diarrhea.  Endocrine: Negative for increased urination.  Genitourinary: Negative for vaginal dryness.  Musculoskeletal: Positive for arthralgias and joint pain. Negative for joint swelling, myalgias, muscle weakness, morning stiffness, muscle tenderness and myalgias.  Skin: Positive for rash. Negative for color change, hair loss, skin tightness, ulcers and sensitivity to sunlight.       History of psoriasis  Allergic/Immunologic: Negative for susceptible to infections.  Neurological: Negative for dizziness, memory loss and night sweats.  Hematological: Negative for swollen glands.  Psychiatric/Behavioral: Negative for depressed mood and sleep disturbance. The patient is nervous/anxious.     PMFS History:  Patient Active Problem List   Diagnosis Date Noted  . Multiple joint pain 05/11/2017  . Psoriasis 05/11/2017  . Positive anti-CCP test 05/11/2017  . Primary osteoarthritis of both hands 05/11/2017  . Other fatigue 05/11/2017  . DDD (degenerative disc disease), cervical 05/11/2017  . DDD (degenerative disc disease), lumbar 05/11/2017  . Routine general medical examination at a health care facility 01/27/2015  . Seasonal and perennial allergic rhinitis 11/02/2013  . IBS (irritable bowel syndrome) 07/09/2013  . Hyperactive 07/09/2013  . Bloating 01/09/2013  . Abnormal ultrasound of endometrium 01/09/2013  .  Menopause syndrome 01/09/2013    Past Medical History:  Diagnosis Date  . NSVD (normal spontaneous vaginal delivery)   . Postmenopausal   . Psoriasis     Family History  Problem Relation Age of  Onset  . Hypertension Mother   . Atrial fibrillation Mother    Past Surgical History:  Procedure Laterality Date  . CESAREAN SECTION  2000   Social History   Social History Narrative   Lives with husband and 2 children (son and daughter).     Objective: Vital Signs: BP 128/78   Pulse 78   Resp 16   Ht '5\' 7"'$  (1.702 m)   Wt 198 lb (89.8 kg)   LMP 07/09/2012   BMI 31.01 kg/m    Physical Exam  Constitutional: She is oriented to person, place, and time. She appears well-developed and well-nourished.  HENT:  Head: Normocephalic and atraumatic.  Eyes: Conjunctivae and EOM are normal.  Neck: Normal range of motion.  Cardiovascular: Normal rate, regular rhythm, normal heart sounds and intact distal pulses.   Pulmonary/Chest: Effort normal and breath sounds normal.  Abdominal: Soft. Bowel sounds are normal.  Lymphadenopathy:    She has no cervical adenopathy.  Neurological: She is alert and oriented to person, place, and time.  Skin: Skin is warm and dry. Capillary refill takes less than 2 seconds.  Psychiatric: She has a normal mood and affect. Her behavior is normal.  Nursing note and vitals reviewed.    Musculoskeletal Exam: C-spine and thoracic lumbar spine good range of motion. Shoulder joints elbow joints wrist joints MCPs PIPs DIPs with good range of motion. She has bilateral DIP PIP thickening and CMC thickening with no active synovitis. Hip joints knee joints ankles MTPs PIPs DIPs are good range of motion. She has bilateral first MTP subluxation and some thickening of PIP/DIP joints in her feet.  CDAI Exam: No CDAI exam completed.    Investigation: Findings:  07/04/2013 We obtained ultrasound examination of her right hand which was most symptomatic.  After informed consent was obtained per EULAR recommendation, ultrasound examination of the right hand was performed using grayscale and 12 MHz transducer and Doppler was used to look for synovitis and tenosynovitis.  I  evaluated her right 2nd and 3rd MCP joint and right 2nd and 3rd PIP joint and all DIP joints also wrist joint.  There was mild activity in the right 2nd MCP joint, but not significant enough.  All the PIP and DIP joints had no synovitis or tenosynovitis.  There was some joint space narrowing in the PIP and DIP joints consistent with osteoarthritis.  Wrist joint examination was also unremarkable for any synovitis or tenosynovitis.  Right median nerve was 0.11 cm square which was upper limits of normal, but she had no symptoms of carpal tunnel syndrome.  These findings are consistent with osteoarthritis.  There was no synovitis noted.    Her labs from June, 2014 sed rate was 1, hepatitis panel was negative, G6PD, TSH were normal.  ANA, rheumatoid factor, CCP, TB Gold and vitamin D were all within normal limits.  She also showed me on her phone her labs from [place] visit, her anti-TTG and anti-gliadin and antibodies were negative.  DEXA 04/30/2013 normal     Imaging: Xr Foot 2 Views Left  Result Date: 05/16/2017 Right first MTP, all PIP/DIP narrowing was noted. No erosive changes were noted. No intertarsal joint space narrowing was noted. Calcaneal spur was noted. Impression these findings are consistent with osteoarthritis of  the foot  Xr Foot 2 Views Right  Result Date: 05/16/2017 Right first MTP, all PIP/DIP narrowing was noted. No erosive changes were noted. No intertarsal joint space narrowing was noted. Calcaneal spur was noted. Impression these findings are consistent with osteoarthritis of the foot  Xr Hand 2 View Left  Result Date: 05/16/2017 PIP/DIP narrowing was noted. No erosive changes were noted. No MCP joint space narrowing was noted. No intercarpal joint or radiocarpal joint space and narrowing was noted. She has [place] joint narrowing. Impression these findings are consistent with osteoarthritis  Xr Hand 2 View Right  Result Date: 05/16/2017 PIP/DIP narrowing was noted. Severe CMC  joint narrowing was noted. No MCP joint narrowing or erosive changes were noted. No intercarpal joint radiocarpal joint space narrowing was noted. Impression: These findings are consistent with osteoarthritis.   Speciality Comments: No specialty comments available.    Procedures:  No procedures performed Allergies: Penicillins and Minocycline   Assessment / Plan:     Visit Diagnoses: Multiple joint pain - 07/04/2013 ultrasound bilateral hands was negative for synovitis. RF, anti-CCP were negative,ESR was 1. Patient gives history of ongoing pain and discomfort in her hands and feet and intermittent swelling. I do not see any synovitis on examination today. She does have DIP PIP thickening of her hands and feet consistent with osteoarthritis. - Plan: CBC with Differential/Platelet, COMPLETE METABOLIC PANEL WITH GFR, VITAMIN D 25 Hydroxy (Vit-D Deficiency, Fractures), Rheumatoid Factor, Cyclic citrul peptide antibody, IgG  Psoriasis. She has few scattered lesions of psoriasis on her extremities.   h/o plantar fascitis: In the past but she has not had any recurrence of plantar fasciitis.  Primary osteoarthritis of both hands - Plan: diclofenac sodium (VOLTAREN) 1 % GEL. List of natural aunt and chemistries was given. I will also schedule ultrasound of bilateral hands to look for synovitis. A list of natural anti-inflammatories was given hand exercises were discussed. Prescription for topical Voltaren gel was also given. Side effects were reviewed. Joint protection and muscle strengthening was discussed. Some exercises were demonstrated in the office and a handout was given.  DDD (degenerative disc disease), cervical: Some discomfort  DDD (degenerative disc disease), lumbar: Chronic pain  Other fatigue - Plan: CBC with Differential/Platelet, COMPLETE METABOLIC PANEL WITH GFR, VITAMIN D 25 Hydroxy (Vit-D Deficiency, Fractures), Rheumatoid Factor, Cyclic citrul peptide antibody,  IgG  Rosacea  Pain in both hands - Plan: XR Hand 2 View Right, XR Hand 2 View Left  Pain in both feet - Plan: XR Foot 2 Views Right, XR Foot 2 Views Left  Encounter for vitamin deficiency screening - Plan: VITAMIN D 25 Hydroxy (Vit-D Deficiency, Fractures)    Orders: Orders Placed This Encounter  Procedures  . XR Hand 2 View Right  . XR Hand 2 View Left  . XR Foot 2 Views Right  . XR Foot 2 Views Left  . CBC with Differential/Platelet  . COMPLETE METABOLIC PANEL WITH GFR  . VITAMIN D 25 Hydroxy (Vit-D Deficiency, Fractures)  . Rheumatoid Factor  . Cyclic citrul peptide antibody, IgG   Meds ordered this encounter  Medications  . diclofenac sodium (VOLTAREN) 1 % GEL    Sig: Apply 2 g topically 4 (four) times daily.    Dispense:  3 Tube    Refill:  3    Face-to-face time spent with patient was 60 minutes. 50% of time was spent in counseling and coordination of care.  Follow-Up Instructions: Return for Osteoarthritis.   Bo Merino, MD  Note -  This record has been created using [employer].  Chart creation errors have been sought, but may not always  have been located. Such creation errors do not reflect on  the standard of medical care.

## 2017-05-16 ENCOUNTER — Ambulatory Visit (INDEPENDENT_AMBULATORY_CARE_PROVIDER_SITE_OTHER): Payer: BLUE CROSS/BLUE SHIELD | Admitting: Rheumatology

## 2017-05-16 ENCOUNTER — Ambulatory Visit (INDEPENDENT_AMBULATORY_CARE_PROVIDER_SITE_OTHER): Payer: Self-pay

## 2017-05-16 ENCOUNTER — Ambulatory Visit (INDEPENDENT_AMBULATORY_CARE_PROVIDER_SITE_OTHER): Payer: BLUE CROSS/BLUE SHIELD

## 2017-05-16 ENCOUNTER — Encounter: Payer: Self-pay | Admitting: Rheumatology

## 2017-05-16 VITALS — BP 128/78 | HR 78 | Resp 16 | Ht 67.0 in | Wt 198.0 lb

## 2017-05-16 DIAGNOSIS — M5136 Other intervertebral disc degeneration, lumbar region: Secondary | ICD-10-CM | POA: Diagnosis not present

## 2017-05-16 DIAGNOSIS — M5030 Other cervical disc degeneration, unspecified cervical region: Secondary | ICD-10-CM

## 2017-05-16 DIAGNOSIS — L719 Rosacea, unspecified: Secondary | ICD-10-CM

## 2017-05-16 DIAGNOSIS — Z1321 Encounter for screening for nutritional disorder: Secondary | ICD-10-CM | POA: Diagnosis not present

## 2017-05-16 DIAGNOSIS — M51369 Other intervertebral disc degeneration, lumbar region without mention of lumbar back pain or lower extremity pain: Secondary | ICD-10-CM

## 2017-05-16 DIAGNOSIS — M19042 Primary osteoarthritis, left hand: Secondary | ICD-10-CM

## 2017-05-16 DIAGNOSIS — M79671 Pain in right foot: Secondary | ICD-10-CM | POA: Diagnosis not present

## 2017-05-16 DIAGNOSIS — M79642 Pain in left hand: Secondary | ICD-10-CM

## 2017-05-16 DIAGNOSIS — M79672 Pain in left foot: Secondary | ICD-10-CM | POA: Diagnosis not present

## 2017-05-16 DIAGNOSIS — L409 Psoriasis, unspecified: Secondary | ICD-10-CM | POA: Diagnosis not present

## 2017-05-16 DIAGNOSIS — M79641 Pain in right hand: Secondary | ICD-10-CM

## 2017-05-16 DIAGNOSIS — R5383 Other fatigue: Secondary | ICD-10-CM

## 2017-05-16 DIAGNOSIS — M19041 Primary osteoarthritis, right hand: Secondary | ICD-10-CM | POA: Diagnosis not present

## 2017-05-16 DIAGNOSIS — M2550 Pain in unspecified joint: Secondary | ICD-10-CM

## 2017-05-16 LAB — CBC WITH DIFFERENTIAL/PLATELET
Basophils Absolute: 61 cells/uL (ref 0–200)
Basophils Relative: 1 %
Eosinophils Absolute: 122 cells/uL (ref 15–500)
Eosinophils Relative: 2 %
HCT: 43.2 % (ref 35.0–45.0)
HEMOGLOBIN: 14.4 g/dL (ref 11.7–15.5)
Lymphocytes Relative: 38 %
Lymphs Abs: 2318 cells/uL (ref 850–3900)
MCH: 31.2 pg (ref 27.0–33.0)
MCHC: 33.3 g/dL (ref 32.0–36.0)
MCV: 93.5 fL (ref 80.0–100.0)
MONO ABS: 427 {cells}/uL (ref 200–950)
MPV: 10 fL (ref 7.5–12.5)
Monocytes Relative: 7 %
NEUTROS PCT: 52 %
Neutro Abs: 3172 cells/uL (ref 1500–7800)
Platelets: 261 10*3/uL (ref 140–400)
RBC: 4.62 MIL/uL (ref 3.80–5.10)
RDW: 12.9 % (ref 11.0–15.0)
WBC: 6.1 10*3/uL (ref 3.8–10.8)

## 2017-05-16 MED ORDER — DICLOFENAC SODIUM 1 % TD GEL: 2.0000 g | Freq: Four times a day (QID) | TRANSDERMAL | 3 refills | Status: DC

## 2017-05-16 NOTE — Patient Instructions (Signed)
 Natural anti-inflammatories  You can purchase these at [employer], [employer] or online.  . Turmeric (capsules)  . Ginger (ginger root or capsules)  . Omega 3 (Fish, flax seeds, chia seeds, walnuts, almonds)  . Tart cherry (dried or extract)   Patient should be under the care of a physician while taking these supplements. This may not be reproduced without the permission of Dr. Tonna Palazzi.   Hand Exercises Hand exercises can be helpful to almost anyone. These exercises can strengthen the hands, improve flexibility and movement, and increase blood flow to the hands. These results can make work and daily tasks easier. Hand exercises can be especially helpful for people who have joint pain from arthritis or have nerve damage from overuse (carpal tunnel syndrome). These exercises can also help people who have injured a hand. Most of these hand exercises are fairly gentle stretching routines. You can do them often throughout the day. Still, it is a good idea to ask your health care provider which exercises would be best for you. Warming your hands before exercise may help to reduce stiffness. You can do this with gentle massage or by placing your hands in warm water for 15 minutes. Also, make sure you pay attention to your level of hand pain as you begin an exercise routine. Exercises Knuckle Bend Repeat this exercise 5-10 times with each hand. 1. Stand or sit with your arm, hand, and all five fingers pointed straight up. Make sure your wrist is straight. 2. Gently and slowly bend your fingers down and inward until the tips of your fingers are touching the tops of your palm. 3. Hold this position for a few seconds. 4. Extend your fingers out to their original position, all pointing straight up again.  Finger Fan Repeat this exercise 5-10 times with each hand. 1. Hold your arm and hand out in front of you. Keep your wrist straight. 2. Squeeze your hand into a fist. 3. Hold this  position for a few seconds. 4. Fan out, or spread apart, your hand and fingers as much as possible, stretching every joint fully.  Tabletop Repeat this exercise 5-10 times with each hand. 1. Stand or sit with your arm, hand, and all five fingers pointed straight up. Make sure your wrist is straight. 2. Gently and slowly bend your fingers at the knuckles where they meet the hand until your hand is making an upside-down L shape. Your fingers should form a tabletop. 3. Hold this position for a few seconds. 4. Extend your fingers out to their original position, all pointing straight up again.  Making Os Repeat this exercise 5-10 times with each hand. 1. Stand or sit with your arm, hand, and all five fingers pointed straight up. Make sure your wrist is straight. 2. Make an O shape by touching your pointer finger to your thumb. Hold for a few seconds. Then open your hand wide. 3. Repeat this motion with each finger on your hand.  Table Spread Repeat this exercise 5-10 times with each hand. 1. Place your hand on a table with your palm facing down. Make sure your wrist is straight. 2. Spread your fingers out as much as possible. Hold this position for a few seconds. 3. Slide your fingers back together again. Hold for a few seconds.  Ball Grip  Repeat this exercise 10-15 times with each hand. 1. Hold a tennis ball or another soft ball in your hand. 2. While slowly increasing pressure, squeeze the ball as   hard as possible. 3. Squeeze as hard as you can for 3-5 seconds. 4. Relax and repeat.  Wrist Curls Repeat this exercise 10-15 times with each hand. 1. Sit in a chair that has armrests. 2. Hold a light weight in your hand, such as a dumbbell that weighs 1-3 pounds (0.5-1.4 kg). Ask your health care provider what weight would be best for you. 3. Rest your hand just over the end of the chair arm with your palm facing up. 4. Gently pivot your wrist up and down while holding the weight. Do not  twist your wrist from side to side.  Contact a health care provider if:  Your hand pain or discomfort gets much worse when you do an exercise.  Your hand pain or discomfort does not improve within 2 hours after you exercise. If you have any of these problems, stop doing these exercises right away. Do not do them again unless your health care provider says that you can. Get help right away if:  You develop sudden, severe hand pain. If this happens, stop doing these exercises right away. Do not do them again unless your health care provider says that you can. This information is not intended to replace advice given to you by your health care provider. Make sure you discuss any questions you have with your health care provider. Document Released: 10/18/2015 Document Revised: 04/13/2016 Document Reviewed: 05/17/2015 Elsevier Interactive Patient Education  2018 Elsevier Inc.  

## 2017-05-17 LAB — COMPLETE METABOLIC PANEL WITH GFR
ALBUMIN: 4.3 g/dL (ref 3.6–5.1)
ALT: 17 U/L (ref 6–29)
AST: 20 U/L (ref 10–35)
Alkaline Phosphatase: 98 U/L (ref 33–130)
BUN: 12 mg/dL (ref 7–25)
CHLORIDE: 103 mmol/L (ref 98–110)
CO2: 25 mmol/L (ref 20–31)
CREATININE: 0.88 mg/dL (ref 0.50–1.05)
Calcium: 9.2 mg/dL (ref 8.6–10.4)
GFR, Est African American: 85 mL/min (ref >=60)
GFR, Est Non African American: 74 mL/min (ref >=60)
GLUCOSE: 93 mg/dL (ref 65–99)
Potassium: 4 mmol/L (ref 3.5–5.3)
SODIUM: 140 mmol/L (ref 135–146)
Total Bilirubin: 0.8 mg/dL (ref 0.2–1.2)
Total Protein: 6.8 g/dL (ref 6.1–8.1)

## 2017-05-17 LAB — VITAMIN D 25 HYDROXY (VIT D DEFICIENCY, FRACTURES): Vit D, 25-Hydroxy: 90 ng/mL (ref 30–100)

## 2017-05-17 LAB — CYCLIC CITRUL PEPTIDE ANTIBODY, IGG: Cyclic Citrullin Peptide Ab: <16 Units

## 2017-05-17 LAB — RHEUMATOID FACTOR: Rhuematoid fact SerPl-aCnc: <14 IU/mL (ref <14)

## 2017-05-17 NOTE — Progress Notes (Signed)
WNL

## 2017-05-18 ENCOUNTER — Telehealth: Payer: Self-pay

## 2017-05-18 NOTE — Telephone Encounter (Signed)
A prior authorization for VoltarenGel has been submitted via cover my meds. Will update once we receive a response.  Arian Mcquitty, [place], CPhT 12:09 PM

## 2017-05-22 ENCOUNTER — Telehealth: Payer: Self-pay | Admitting: Rheumatology

## 2017-05-22 NOTE — Telephone Encounter (Signed)
Patient returning your call.

## 2017-05-22 NOTE — Telephone Encounter (Signed)
Received a fax from [facility] regarding a prior authorization DENIAL for Voltaren Gel. The medication is only covered when the patient has tried and failed an oral anti-inflammatory.  Reference number:[id number] Phone [phone]  Will send document to scan center.  After reviewing the patient's profile from 2014, it states that the patient has tried and failed aleve. Spoke with Theadora Rama from [facility] to see if we can resubmit the authorization with that information. The office note from 07/04/2013 and a cover letter with the reference number was faxed to [phone] for PC Review. We should have an answer within 7 business days.   Will update once we receive a response.   Eathel Pajak, [employer], CPhT   9:17 AM

## 2017-06-05 ENCOUNTER — Ambulatory Visit: Payer: BLUE CROSS/BLUE SHIELD | Admitting: Rheumatology

## 2017-06-14 DIAGNOSIS — M19072 Primary osteoarthritis, left ankle and foot: Secondary | ICD-10-CM

## 2017-06-14 DIAGNOSIS — M19071 Primary osteoarthritis, right ankle and foot: Secondary | ICD-10-CM | POA: Insufficient documentation

## 2017-06-14 DIAGNOSIS — M722 Plantar fascial fibromatosis: Secondary | ICD-10-CM | POA: Insufficient documentation

## 2017-06-14 NOTE — Progress Notes (Deleted)
Office Visit Note  Patient: Amanda Barber             Date of Birth: 06-27-1961           MRN: [id number]             PCP: Carol Ada, MD Referring: Carol Ada, MD Visit Date: 06/19/2017 Occupation: @GUAROCC @    Subjective:  No chief complaint on file.   History of Present Illness: Amanda Barber is a 56 y.o. female ***   Activities of Daily Living:  Patient reports morning stiffness for *** {minute/hour:19697}.   Patient {ACTIONS;DENIES/REPORTS:21021675::"Denies"} nocturnal pain.  Difficulty dressing/grooming: {ACTIONS;DENIES/REPORTS:21021675::"Denies"} Difficulty climbing stairs: {ACTIONS;DENIES/REPORTS:21021675::"Denies"} Difficulty getting out of chair: {ACTIONS;DENIES/REPORTS:21021675::"Denies"} Difficulty using hands for taps, buttons, cutlery, and/or writing: {ACTIONS;DENIES/REPORTS:21021675::"Denies"}   No Rheumatology ROS completed.   PMFS History:  Patient Active Problem List   Diagnosis Date Noted  . Multiple joint pain 05/11/2017  . Psoriasis 05/11/2017  . Positive anti-CCP test 05/11/2017  . Primary osteoarthritis of both hands 05/11/2017  . Other fatigue 05/11/2017  . DDD (degenerative disc disease), cervical 05/11/2017  . DDD (degenerative disc disease), lumbar 05/11/2017  . Routine general medical examination at a health care facility 01/27/2015  . Seasonal and perennial allergic rhinitis 11/02/2013  . IBS (irritable bowel syndrome) 07/09/2013  . Hyperactive 07/09/2013  . Bloating 01/09/2013  . Abnormal ultrasound of endometrium 01/09/2013  . Menopause syndrome 01/09/2013    Past Medical History:  Diagnosis Date  . NSVD (normal spontaneous vaginal delivery)   . Postmenopausal   . Psoriasis     Family History  Problem Relation Age of Onset  . Hypertension Mother   . Atrial fibrillation Mother    Past Surgical History:  Procedure Laterality Date  . CESAREAN SECTION  2000   Social History   Social History Narrative   Lives with husband  and 2 children (son and daughter).     Objective: Vital Signs: LMP 07/09/2012    Physical Exam   Musculoskeletal Exam: ***  CDAI Exam: No CDAI exam completed.    Investigation: No additional findings.  05/16/2017 CBC normal, CMP normal, vitamin D 90, RF negative, anti-CCP negative Imaging: Xr Foot 2 Views Left  Result Date: 05/16/2017 Right first MTP, all PIP/DIP narrowing was noted. No erosive changes were noted. No intertarsal joint space narrowing was noted. Calcaneal spur was noted. Impression these findings are consistent with osteoarthritis of the foot  Xr Foot 2 Views Right  Result Date: 05/16/2017 Right first MTP, all PIP/DIP narrowing was noted. No erosive changes were noted. No intertarsal joint space narrowing was noted. Calcaneal spur was noted. Impression these findings are consistent with osteoarthritis of the foot  Xr Hand 2 View Left  Result Date: 05/16/2017 PIP/DIP narrowing was noted. No erosive changes were noted. No MCP joint space narrowing was noted. No intercarpal joint or radiocarpal joint space and narrowing was noted. She has [place] joint narrowing. Impression these findings are consistent with osteoarthritis  Xr Hand 2 View Right  Result Date: 05/16/2017 PIP/DIP narrowing was noted. Severe CMC joint narrowing was noted. No MCP joint narrowing or erosive changes were noted. No intercarpal joint radiocarpal joint space narrowing was noted. Impression: These findings are consistent with osteoarthritis.   Speciality Comments: No specialty comments available.    Procedures:  No procedures performed Allergies: Penicillins and Minocycline   Assessment / Plan:     Visit Diagnoses: No diagnosis found.    Orders: No orders of the defined types were placed in this  encounter.  No orders of the defined types were placed in this encounter.   Face-to-face time spent with patient was *** minutes. 50% of time was spent in counseling and coordination of  care.  Follow-Up Instructions: No Follow-up on file.   Bo Merino, MD  Note - This record has been created using [employer].  Chart creation errors have been sought, but may not always  have been located. Such creation errors do not reflect on  the standard of medical care.

## 2017-06-19 ENCOUNTER — Ambulatory Visit: Payer: BLUE CROSS/BLUE SHIELD | Admitting: Rheumatology

## 2017-07-10 NOTE — Progress Notes (Deleted)
   Office Visit Note  Patient: Amanda Barber             Date of Birth: 02-10-1961           MRN: [id number]             PCP: Carol Ada, MD Referring: Carol Ada, MD Visit Date: 07/12/2017 Occupation: @GUAROCC @    Subjective:  No chief complaint on file.   History of Present Illness: Amanda Barber is a 56 y.o. female ***   Activities of Daily Living:  Patient reports morning stiffness for *** {minute/hour:19697}.   Patient {ACTIONS;DENIES/REPORTS:21021675::"Denies"} nocturnal pain.  Difficulty dressing/grooming: {ACTIONS;DENIES/REPORTS:21021675::"Denies"} Difficulty climbing stairs: {ACTIONS;DENIES/REPORTS:21021675::"Denies"} Difficulty getting out of chair: {ACTIONS;DENIES/REPORTS:21021675::"Denies"} Difficulty using hands for taps, buttons, cutlery, and/or writing: {ACTIONS;DENIES/REPORTS:21021675::"Denies"}   No Rheumatology ROS completed.   PMFS History:  Patient Active Problem List   Diagnosis Date Noted  . History of Plantar fasciitis 06/14/2017  . Primary osteoarthritis of both feet 06/14/2017  . Multiple joint pain 05/11/2017  . Psoriasis 05/11/2017  . Positive anti-CCP test 05/11/2017  . Primary osteoarthritis of both hands 05/11/2017  . Other fatigue 05/11/2017  . DDD (degenerative disc disease), cervical 05/11/2017  . DDD (degenerative disc disease), lumbar 05/11/2017  . Routine general medical examination at a health care facility 01/27/2015  . Seasonal and perennial allergic rhinitis 11/02/2013  . IBS (irritable bowel syndrome) 07/09/2013  . Hyperactive 07/09/2013  . Bloating 01/09/2013  . Abnormal ultrasound of endometrium 01/09/2013  . Menopause syndrome 01/09/2013    Past Medical History:  Diagnosis Date  . NSVD (normal spontaneous vaginal delivery)   . Postmenopausal   . Psoriasis     Family History  Problem Relation Age of Onset  . Hypertension Mother   . Atrial fibrillation Mother    Past Surgical History:  Procedure Laterality Date    . CESAREAN SECTION  2000   Social History   Social History Narrative   Lives with husband and 2 children (son and daughter).     Objective: Vital Signs: LMP 07/09/2012    Physical Exam   Musculoskeletal Exam: ***  CDAI Exam: No CDAI exam completed.    Investigation: No additional findings. 05/16/2017 CBC normal, CMP normal, vitamin D 90, RF negative, anti-CCP negative  Imaging: No results found.  Speciality Comments: No specialty comments available.    Procedures:  No procedures performed Allergies: Penicillins and Minocycline   Assessment / Plan:     Visit Diagnoses: No diagnosis found.    Orders: No orders of the defined types were placed in this encounter.  No orders of the defined types were placed in this encounter.   Face-to-face time spent with patient was *** minutes. 50% of time was spent in counseling and coordination of care.  Follow-Up Instructions: No Follow-up on file.   Bo Merino, MD  Note - This record has been created using [employer], [employer].  Chart creation errors have been sought, but may not always  have been located. Such creation errors do not reflect on  the standard of medical care.

## 2017-07-12 ENCOUNTER — Ambulatory Visit: Payer: BLUE CROSS/BLUE SHIELD | Admitting: Rheumatology

## 2017-08-03 NOTE — Progress Notes (Deleted)
Assessment / Plan:     Visit Diagnoses: Multiple joint pain - 07/04/2013 ultrasound bilateral hands was negative for synovitis. RF, anti-CCP were negative,ESR was 1. Patient gives history of ongoing pain and discomfort in her hands and feet and intermittent swelling. I do not see any synovitis on examination today. She does have DIP PIP thickening of her hands and feet consistent with osteoarthritis. - Plan: CBC with Differential/Platelet, COMPLETE METABOLIC PANEL WITH GFR, VITAMIN D 25 Hydroxy (Vit-D Deficiency, Fractures), Rheumatoid Factor, Cyclic citrul peptide antibody, IgG  Psoriasis. She has few scattered lesions of psoriasis on her extremities.   h/o plantar fascitis: In the past but she has not had any recurrence of plantar fasciitis.

## 2017-08-08 ENCOUNTER — Other Ambulatory Visit: Payer: BLUE CROSS/BLUE SHIELD | Admitting: Rheumatology

## 2017-08-21 DIAGNOSIS — Z411 Encounter for cosmetic surgery: Secondary | ICD-10-CM | POA: Diagnosis not present

## 2017-08-21 DIAGNOSIS — L719 Rosacea, unspecified: Secondary | ICD-10-CM | POA: Diagnosis not present

## 2017-08-21 DIAGNOSIS — L821 Other seborrheic keratosis: Secondary | ICD-10-CM | POA: Diagnosis not present

## 2017-08-21 DIAGNOSIS — L680 Hirsutism: Secondary | ICD-10-CM | POA: Diagnosis not present

## 2017-12-28 DIAGNOSIS — Z13220 Encounter for screening for lipoid disorders: Secondary | ICD-10-CM | POA: Diagnosis not present

## 2017-12-28 DIAGNOSIS — E559 Vitamin D deficiency, unspecified: Secondary | ICD-10-CM | POA: Diagnosis not present

## 2017-12-28 DIAGNOSIS — Z111 Encounter for screening for respiratory tuberculosis: Secondary | ICD-10-CM | POA: Diagnosis not present

## 2017-12-28 DIAGNOSIS — Z0000 Encounter for general adult medical examination without abnormal findings: Secondary | ICD-10-CM | POA: Diagnosis not present

## 2018-02-21 DIAGNOSIS — L409 Psoriasis, unspecified: Secondary | ICD-10-CM | POA: Diagnosis not present

## 2018-02-21 DIAGNOSIS — L304 Erythema intertrigo: Secondary | ICD-10-CM | POA: Diagnosis not present

## 2018-02-21 DIAGNOSIS — L719 Rosacea, unspecified: Secondary | ICD-10-CM | POA: Diagnosis not present

## 2018-05-14 DIAGNOSIS — L719 Rosacea, unspecified: Secondary | ICD-10-CM | POA: Diagnosis not present

## 2018-05-14 DIAGNOSIS — L304 Erythema intertrigo: Secondary | ICD-10-CM | POA: Diagnosis not present

## 2018-10-01 DIAGNOSIS — L738 Other specified follicular disorders: Secondary | ICD-10-CM | POA: Diagnosis not present

## 2018-10-01 DIAGNOSIS — L309 Dermatitis, unspecified: Secondary | ICD-10-CM | POA: Diagnosis not present

## 2018-10-01 DIAGNOSIS — L719 Rosacea, unspecified: Secondary | ICD-10-CM | POA: Diagnosis not present

## 2019-08-12 DIAGNOSIS — L820 Inflamed seborrheic keratosis: Secondary | ICD-10-CM | POA: Diagnosis not present

## 2019-08-12 DIAGNOSIS — L409 Psoriasis, unspecified: Secondary | ICD-10-CM | POA: Diagnosis not present

## 2019-08-12 DIAGNOSIS — L719 Rosacea, unspecified: Secondary | ICD-10-CM | POA: Diagnosis not present

## 2019-08-14 DIAGNOSIS — L400 Psoriasis vulgaris: Secondary | ICD-10-CM | POA: Diagnosis not present

## 2019-08-21 DIAGNOSIS — L400 Psoriasis vulgaris: Secondary | ICD-10-CM | POA: Diagnosis not present

## 2019-08-22 DIAGNOSIS — F419 Anxiety disorder, unspecified: Secondary | ICD-10-CM | POA: Diagnosis not present

## 2019-08-25 DIAGNOSIS — L400 Psoriasis vulgaris: Secondary | ICD-10-CM | POA: Diagnosis not present

## 2019-08-27 DIAGNOSIS — L400 Psoriasis vulgaris: Secondary | ICD-10-CM | POA: Diagnosis not present

## 2019-08-29 DIAGNOSIS — L400 Psoriasis vulgaris: Secondary | ICD-10-CM | POA: Diagnosis not present

## 2019-09-01 DIAGNOSIS — L400 Psoriasis vulgaris: Secondary | ICD-10-CM | POA: Diagnosis not present

## 2019-09-03 DIAGNOSIS — L400 Psoriasis vulgaris: Secondary | ICD-10-CM | POA: Diagnosis not present

## 2019-09-05 DIAGNOSIS — L400 Psoriasis vulgaris: Secondary | ICD-10-CM | POA: Diagnosis not present

## 2019-09-08 DIAGNOSIS — L400 Psoriasis vulgaris: Secondary | ICD-10-CM | POA: Diagnosis not present

## 2019-09-10 DIAGNOSIS — L400 Psoriasis vulgaris: Secondary | ICD-10-CM | POA: Diagnosis not present

## 2019-09-12 DIAGNOSIS — L400 Psoriasis vulgaris: Secondary | ICD-10-CM | POA: Diagnosis not present

## 2019-09-15 DIAGNOSIS — L400 Psoriasis vulgaris: Secondary | ICD-10-CM | POA: Diagnosis not present

## 2019-09-17 DIAGNOSIS — L400 Psoriasis vulgaris: Secondary | ICD-10-CM | POA: Diagnosis not present

## 2019-09-19 DIAGNOSIS — L400 Psoriasis vulgaris: Secondary | ICD-10-CM | POA: Diagnosis not present

## 2019-09-22 DIAGNOSIS — L400 Psoriasis vulgaris: Secondary | ICD-10-CM | POA: Diagnosis not present

## 2019-09-24 DIAGNOSIS — L400 Psoriasis vulgaris: Secondary | ICD-10-CM | POA: Diagnosis not present

## 2019-09-26 DIAGNOSIS — L400 Psoriasis vulgaris: Secondary | ICD-10-CM | POA: Diagnosis not present

## 2019-09-29 DIAGNOSIS — L400 Psoriasis vulgaris: Secondary | ICD-10-CM | POA: Diagnosis not present

## 2019-10-01 DIAGNOSIS — L400 Psoriasis vulgaris: Secondary | ICD-10-CM | POA: Diagnosis not present

## 2019-10-03 DIAGNOSIS — L400 Psoriasis vulgaris: Secondary | ICD-10-CM | POA: Diagnosis not present

## 2019-10-06 DIAGNOSIS — L400 Psoriasis vulgaris: Secondary | ICD-10-CM | POA: Diagnosis not present

## 2019-10-08 DIAGNOSIS — L400 Psoriasis vulgaris: Secondary | ICD-10-CM | POA: Diagnosis not present

## 2019-10-10 DIAGNOSIS — L400 Psoriasis vulgaris: Secondary | ICD-10-CM | POA: Diagnosis not present

## 2019-10-13 DIAGNOSIS — L400 Psoriasis vulgaris: Secondary | ICD-10-CM | POA: Diagnosis not present

## 2019-10-15 DIAGNOSIS — L400 Psoriasis vulgaris: Secondary | ICD-10-CM | POA: Diagnosis not present

## 2019-10-20 DIAGNOSIS — L400 Psoriasis vulgaris: Secondary | ICD-10-CM | POA: Diagnosis not present

## 2019-10-22 DIAGNOSIS — L400 Psoriasis vulgaris: Secondary | ICD-10-CM | POA: Diagnosis not present

## 2019-10-24 DIAGNOSIS — L400 Psoriasis vulgaris: Secondary | ICD-10-CM | POA: Diagnosis not present

## 2019-10-27 DIAGNOSIS — L400 Psoriasis vulgaris: Secondary | ICD-10-CM | POA: Diagnosis not present

## 2019-10-29 DIAGNOSIS — L400 Psoriasis vulgaris: Secondary | ICD-10-CM | POA: Diagnosis not present

## 2019-10-31 DIAGNOSIS — L400 Psoriasis vulgaris: Secondary | ICD-10-CM | POA: Diagnosis not present

## 2019-11-03 DIAGNOSIS — L400 Psoriasis vulgaris: Secondary | ICD-10-CM | POA: Diagnosis not present

## 2019-11-07 DIAGNOSIS — L400 Psoriasis vulgaris: Secondary | ICD-10-CM | POA: Diagnosis not present

## 2019-11-10 DIAGNOSIS — L400 Psoriasis vulgaris: Secondary | ICD-10-CM | POA: Diagnosis not present

## 2019-11-17 DIAGNOSIS — L400 Psoriasis vulgaris: Secondary | ICD-10-CM | POA: Diagnosis not present

## 2019-11-19 DIAGNOSIS — L400 Psoriasis vulgaris: Secondary | ICD-10-CM | POA: Diagnosis not present

## 2019-11-24 DIAGNOSIS — L400 Psoriasis vulgaris: Secondary | ICD-10-CM | POA: Diagnosis not present

## 2019-11-26 DIAGNOSIS — L400 Psoriasis vulgaris: Secondary | ICD-10-CM | POA: Diagnosis not present

## 2019-11-28 DIAGNOSIS — L400 Psoriasis vulgaris: Secondary | ICD-10-CM | POA: Diagnosis not present

## 2019-12-01 DIAGNOSIS — L400 Psoriasis vulgaris: Secondary | ICD-10-CM | POA: Diagnosis not present

## 2019-12-03 DIAGNOSIS — L400 Psoriasis vulgaris: Secondary | ICD-10-CM | POA: Diagnosis not present

## 2019-12-08 DIAGNOSIS — L400 Psoriasis vulgaris: Secondary | ICD-10-CM | POA: Diagnosis not present

## 2019-12-10 DIAGNOSIS — L400 Psoriasis vulgaris: Secondary | ICD-10-CM | POA: Diagnosis not present

## 2019-12-12 DIAGNOSIS — L400 Psoriasis vulgaris: Secondary | ICD-10-CM | POA: Diagnosis not present

## 2019-12-15 DIAGNOSIS — L400 Psoriasis vulgaris: Secondary | ICD-10-CM | POA: Diagnosis not present

## 2019-12-17 DIAGNOSIS — L400 Psoriasis vulgaris: Secondary | ICD-10-CM | POA: Diagnosis not present

## 2019-12-22 DIAGNOSIS — L400 Psoriasis vulgaris: Secondary | ICD-10-CM | POA: Diagnosis not present

## 2019-12-26 DIAGNOSIS — L400 Psoriasis vulgaris: Secondary | ICD-10-CM | POA: Diagnosis not present

## 2019-12-29 DIAGNOSIS — L400 Psoriasis vulgaris: Secondary | ICD-10-CM | POA: Diagnosis not present

## 2019-12-31 DIAGNOSIS — L400 Psoriasis vulgaris: Secondary | ICD-10-CM | POA: Diagnosis not present

## 2020-01-05 DIAGNOSIS — L400 Psoriasis vulgaris: Secondary | ICD-10-CM | POA: Diagnosis not present

## 2020-01-07 DIAGNOSIS — L400 Psoriasis vulgaris: Secondary | ICD-10-CM | POA: Diagnosis not present

## 2020-01-09 DIAGNOSIS — L400 Psoriasis vulgaris: Secondary | ICD-10-CM | POA: Diagnosis not present

## 2020-01-12 DIAGNOSIS — L400 Psoriasis vulgaris: Secondary | ICD-10-CM | POA: Diagnosis not present

## 2020-01-14 DIAGNOSIS — L400 Psoriasis vulgaris: Secondary | ICD-10-CM | POA: Diagnosis not present

## 2020-01-16 DIAGNOSIS — L400 Psoriasis vulgaris: Secondary | ICD-10-CM | POA: Diagnosis not present

## 2020-01-19 DIAGNOSIS — L400 Psoriasis vulgaris: Secondary | ICD-10-CM | POA: Diagnosis not present

## 2020-01-21 DIAGNOSIS — L400 Psoriasis vulgaris: Secondary | ICD-10-CM | POA: Diagnosis not present

## 2020-01-23 DIAGNOSIS — L400 Psoriasis vulgaris: Secondary | ICD-10-CM | POA: Diagnosis not present

## 2020-01-26 DIAGNOSIS — L400 Psoriasis vulgaris: Secondary | ICD-10-CM | POA: Diagnosis not present

## 2020-01-28 DIAGNOSIS — L400 Psoriasis vulgaris: Secondary | ICD-10-CM | POA: Diagnosis not present

## 2020-01-30 DIAGNOSIS — L400 Psoriasis vulgaris: Secondary | ICD-10-CM | POA: Diagnosis not present

## 2020-02-02 DIAGNOSIS — L400 Psoriasis vulgaris: Secondary | ICD-10-CM | POA: Diagnosis not present

## 2020-02-04 DIAGNOSIS — L400 Psoriasis vulgaris: Secondary | ICD-10-CM | POA: Diagnosis not present

## 2020-02-09 DIAGNOSIS — L400 Psoriasis vulgaris: Secondary | ICD-10-CM | POA: Diagnosis not present

## 2020-02-11 DIAGNOSIS — L400 Psoriasis vulgaris: Secondary | ICD-10-CM | POA: Diagnosis not present

## 2020-02-13 DIAGNOSIS — L400 Psoriasis vulgaris: Secondary | ICD-10-CM | POA: Diagnosis not present

## 2020-02-16 DIAGNOSIS — L400 Psoriasis vulgaris: Secondary | ICD-10-CM | POA: Diagnosis not present

## 2020-02-19 DIAGNOSIS — L400 Psoriasis vulgaris: Secondary | ICD-10-CM | POA: Diagnosis not present

## 2020-02-23 DIAGNOSIS — L400 Psoriasis vulgaris: Secondary | ICD-10-CM | POA: Diagnosis not present

## 2020-02-25 DIAGNOSIS — L400 Psoriasis vulgaris: Secondary | ICD-10-CM | POA: Diagnosis not present

## 2020-02-27 DIAGNOSIS — L400 Psoriasis vulgaris: Secondary | ICD-10-CM | POA: Diagnosis not present

## 2020-03-01 DIAGNOSIS — L400 Psoriasis vulgaris: Secondary | ICD-10-CM | POA: Diagnosis not present

## 2020-03-03 DIAGNOSIS — L400 Psoriasis vulgaris: Secondary | ICD-10-CM | POA: Diagnosis not present

## 2020-03-05 DIAGNOSIS — L400 Psoriasis vulgaris: Secondary | ICD-10-CM | POA: Diagnosis not present

## 2020-03-08 DIAGNOSIS — L400 Psoriasis vulgaris: Secondary | ICD-10-CM | POA: Diagnosis not present

## 2020-03-15 DIAGNOSIS — L400 Psoriasis vulgaris: Secondary | ICD-10-CM | POA: Diagnosis not present

## 2020-04-08 DIAGNOSIS — F439 Reaction to severe stress, unspecified: Secondary | ICD-10-CM | POA: Diagnosis not present

## 2020-04-29 DIAGNOSIS — F320 Major depressive disorder, single episode, mild: Secondary | ICD-10-CM | POA: Diagnosis not present

## 2020-04-29 DIAGNOSIS — F411 Generalized anxiety disorder: Secondary | ICD-10-CM | POA: Diagnosis not present

## 2020-05-14 DIAGNOSIS — F320 Major depressive disorder, single episode, mild: Secondary | ICD-10-CM | POA: Diagnosis not present

## 2020-05-14 DIAGNOSIS — F411 Generalized anxiety disorder: Secondary | ICD-10-CM | POA: Diagnosis not present

## 2020-06-02 DIAGNOSIS — F411 Generalized anxiety disorder: Secondary | ICD-10-CM | POA: Diagnosis not present

## 2020-06-02 DIAGNOSIS — F320 Major depressive disorder, single episode, mild: Secondary | ICD-10-CM | POA: Diagnosis not present

## 2020-07-21 DIAGNOSIS — F419 Anxiety disorder, unspecified: Secondary | ICD-10-CM | POA: Diagnosis not present

## 2020-07-28 DIAGNOSIS — F419 Anxiety disorder, unspecified: Secondary | ICD-10-CM | POA: Diagnosis not present

## 2020-08-05 DIAGNOSIS — F419 Anxiety disorder, unspecified: Secondary | ICD-10-CM | POA: Diagnosis not present

## 2020-08-11 DIAGNOSIS — F419 Anxiety disorder, unspecified: Secondary | ICD-10-CM | POA: Diagnosis not present

## 2020-08-19 DIAGNOSIS — F419 Anxiety disorder, unspecified: Secondary | ICD-10-CM | POA: Diagnosis not present

## 2020-08-25 DIAGNOSIS — F419 Anxiety disorder, unspecified: Secondary | ICD-10-CM | POA: Diagnosis not present

## 2020-08-26 DIAGNOSIS — E559 Vitamin D deficiency, unspecified: Secondary | ICD-10-CM | POA: Diagnosis not present

## 2020-08-26 DIAGNOSIS — E785 Hyperlipidemia, unspecified: Secondary | ICD-10-CM | POA: Diagnosis not present

## 2020-08-26 DIAGNOSIS — F419 Anxiety disorder, unspecified: Secondary | ICD-10-CM | POA: Diagnosis not present

## 2020-08-26 DIAGNOSIS — R7309 Other abnormal glucose: Secondary | ICD-10-CM | POA: Diagnosis not present

## 2020-08-27 DIAGNOSIS — E663 Overweight: Secondary | ICD-10-CM | POA: Diagnosis not present

## 2020-08-27 DIAGNOSIS — E875 Hyperkalemia: Secondary | ICD-10-CM | POA: Diagnosis not present

## 2020-09-02 DIAGNOSIS — F419 Anxiety disorder, unspecified: Secondary | ICD-10-CM | POA: Diagnosis not present

## 2020-09-08 DIAGNOSIS — F419 Anxiety disorder, unspecified: Secondary | ICD-10-CM | POA: Diagnosis not present

## 2020-09-15 DIAGNOSIS — F419 Anxiety disorder, unspecified: Secondary | ICD-10-CM | POA: Diagnosis not present

## 2020-09-28 DIAGNOSIS — F419 Anxiety disorder, unspecified: Secondary | ICD-10-CM | POA: Diagnosis not present

## 2020-10-13 DIAGNOSIS — Z62820 Parent-biological child conflict: Secondary | ICD-10-CM | POA: Diagnosis not present

## 2020-10-13 DIAGNOSIS — F419 Anxiety disorder, unspecified: Secondary | ICD-10-CM | POA: Diagnosis not present

## 2020-10-19 DIAGNOSIS — Z62820 Parent-biological child conflict: Secondary | ICD-10-CM | POA: Diagnosis not present

## 2020-10-19 DIAGNOSIS — F419 Anxiety disorder, unspecified: Secondary | ICD-10-CM | POA: Diagnosis not present

## 2020-11-03 DIAGNOSIS — F419 Anxiety disorder, unspecified: Secondary | ICD-10-CM | POA: Diagnosis not present

## 2020-11-24 DIAGNOSIS — F419 Anxiety disorder, unspecified: Secondary | ICD-10-CM | POA: Diagnosis not present

## 2020-12-30 DIAGNOSIS — F419 Anxiety disorder, unspecified: Secondary | ICD-10-CM | POA: Diagnosis not present

## 2021-03-01 DIAGNOSIS — F419 Anxiety disorder, unspecified: Secondary | ICD-10-CM | POA: Diagnosis not present

## 2021-03-01 DIAGNOSIS — Z0000 Encounter for general adult medical examination without abnormal findings: Secondary | ICD-10-CM | POA: Diagnosis not present

## 2021-03-01 DIAGNOSIS — E785 Hyperlipidemia, unspecified: Secondary | ICD-10-CM | POA: Diagnosis not present

## 2021-04-06 DIAGNOSIS — Z62820 Parent-biological child conflict: Secondary | ICD-10-CM | POA: Diagnosis not present

## 2021-04-06 DIAGNOSIS — F419 Anxiety disorder, unspecified: Secondary | ICD-10-CM | POA: Diagnosis not present

## 2021-04-25 DIAGNOSIS — F419 Anxiety disorder, unspecified: Secondary | ICD-10-CM | POA: Diagnosis not present

## 2021-04-28 DIAGNOSIS — F439 Reaction to severe stress, unspecified: Secondary | ICD-10-CM | POA: Diagnosis not present

## 2021-06-22 DIAGNOSIS — Z62820 Parent-biological child conflict: Secondary | ICD-10-CM | POA: Diagnosis not present

## 2021-06-22 DIAGNOSIS — F419 Anxiety disorder, unspecified: Secondary | ICD-10-CM | POA: Diagnosis not present

## 2021-07-13 DIAGNOSIS — F419 Anxiety disorder, unspecified: Secondary | ICD-10-CM | POA: Diagnosis not present

## 2021-07-13 DIAGNOSIS — Z62820 Parent-biological child conflict: Secondary | ICD-10-CM | POA: Diagnosis not present

## 2021-07-20 DIAGNOSIS — Z62820 Parent-biological child conflict: Secondary | ICD-10-CM | POA: Diagnosis not present

## 2021-07-20 DIAGNOSIS — F419 Anxiety disorder, unspecified: Secondary | ICD-10-CM | POA: Diagnosis not present

## 2021-08-03 DIAGNOSIS — Z62820 Parent-biological child conflict: Secondary | ICD-10-CM | POA: Diagnosis not present

## 2021-08-03 DIAGNOSIS — F419 Anxiety disorder, unspecified: Secondary | ICD-10-CM | POA: Diagnosis not present

## 2021-08-15 DIAGNOSIS — Z62820 Parent-biological child conflict: Secondary | ICD-10-CM | POA: Diagnosis not present

## 2021-08-15 DIAGNOSIS — F419 Anxiety disorder, unspecified: Secondary | ICD-10-CM | POA: Diagnosis not present

## 2021-10-03 DIAGNOSIS — J069 Acute upper respiratory infection, unspecified: Secondary | ICD-10-CM | POA: Diagnosis not present

## 2021-10-11 DIAGNOSIS — J0110 Acute frontal sinusitis, unspecified: Secondary | ICD-10-CM | POA: Diagnosis not present

## 2021-10-27 DIAGNOSIS — L400 Psoriasis vulgaris: Secondary | ICD-10-CM | POA: Diagnosis not present

## 2021-10-27 DIAGNOSIS — L570 Actinic keratosis: Secondary | ICD-10-CM | POA: Diagnosis not present

## 2021-10-27 DIAGNOSIS — L718 Other rosacea: Secondary | ICD-10-CM | POA: Diagnosis not present

## 2021-11-01 DIAGNOSIS — F419 Anxiety disorder, unspecified: Secondary | ICD-10-CM | POA: Diagnosis not present

## 2021-11-01 DIAGNOSIS — R21 Rash and other nonspecific skin eruption: Secondary | ICD-10-CM | POA: Diagnosis not present

## 2021-11-01 DIAGNOSIS — J069 Acute upper respiratory infection, unspecified: Secondary | ICD-10-CM | POA: Diagnosis not present

## 2022-02-02 ENCOUNTER — Ambulatory Visit: Payer: BLUE CROSS/BLUE SHIELD | Admitting: Family Medicine

## 2022-02-08 DIAGNOSIS — L409 Psoriasis, unspecified: Secondary | ICD-10-CM | POA: Diagnosis not present

## 2022-02-08 DIAGNOSIS — Z411 Encounter for cosmetic surgery: Secondary | ICD-10-CM | POA: Diagnosis not present

## 2022-02-08 DIAGNOSIS — L719 Rosacea, unspecified: Secondary | ICD-10-CM | POA: Diagnosis not present

## 2022-02-08 DIAGNOSIS — L570 Actinic keratosis: Secondary | ICD-10-CM | POA: Diagnosis not present

## 2022-03-01 DIAGNOSIS — H524 Presbyopia: Secondary | ICD-10-CM | POA: Diagnosis not present

## 2022-03-10 DIAGNOSIS — F4323 Adjustment disorder with mixed anxiety and depressed mood: Secondary | ICD-10-CM | POA: Diagnosis not present

## 2022-06-05 DIAGNOSIS — Z6828 Body mass index (BMI) 28.0-28.9, adult: Secondary | ICD-10-CM | POA: Diagnosis not present

## 2022-06-05 DIAGNOSIS — Z01419 Encounter for gynecological examination (general) (routine) without abnormal findings: Secondary | ICD-10-CM | POA: Diagnosis not present

## 2022-06-05 DIAGNOSIS — Z124 Encounter for screening for malignant neoplasm of cervix: Secondary | ICD-10-CM | POA: Diagnosis not present

## 2022-07-03 DIAGNOSIS — E2740 Unspecified adrenocortical insufficiency: Secondary | ICD-10-CM | POA: Diagnosis not present

## 2022-08-22 DIAGNOSIS — Z13820 Encounter for screening for osteoporosis: Secondary | ICD-10-CM | POA: Diagnosis not present

## 2022-08-22 DIAGNOSIS — Z1231 Encounter for screening mammogram for malignant neoplasm of breast: Secondary | ICD-10-CM | POA: Diagnosis not present

## 2022-09-14 ENCOUNTER — Telehealth: Payer: Self-pay | Admitting: Family Medicine

## 2022-09-14 NOTE — Telephone Encounter (Signed)
Yes thanks 

## 2022-09-14 NOTE — Telephone Encounter (Signed)
Pt is asking if Amanda Barber would accept her a new patient. Her mother is a current pt, [name]. Please advise

## 2022-09-15 NOTE — Telephone Encounter (Signed)
LVM to schedule New Pt appt toward the end of Nov / beginning of Dec.

## 2022-10-17 ENCOUNTER — Encounter: Payer: Self-pay | Admitting: Internal Medicine

## 2022-10-17 ENCOUNTER — Encounter: Payer: Self-pay | Admitting: Family Medicine

## 2022-10-17 ENCOUNTER — Ambulatory Visit (INDEPENDENT_AMBULATORY_CARE_PROVIDER_SITE_OTHER): Payer: Self-pay | Admitting: Family Medicine

## 2022-10-17 VITALS — BP 122/70 | HR 66 | Temp 97.9°F | Ht 66.0 in | Wt 182.2 lb

## 2022-10-17 DIAGNOSIS — Z1211 Encounter for screening for malignant neoplasm of colon: Secondary | ICD-10-CM

## 2022-10-17 DIAGNOSIS — F419 Anxiety disorder, unspecified: Secondary | ICD-10-CM

## 2022-10-17 DIAGNOSIS — F439 Reaction to severe stress, unspecified: Secondary | ICD-10-CM | POA: Insufficient documentation

## 2022-10-17 DIAGNOSIS — F909 Attention-deficit hyperactivity disorder, unspecified type: Secondary | ICD-10-CM

## 2022-10-17 DIAGNOSIS — Z636 Dependent relative needing care at home: Secondary | ICD-10-CM

## 2022-10-17 DIAGNOSIS — L409 Psoriasis, unspecified: Secondary | ICD-10-CM

## 2022-10-17 DIAGNOSIS — K589 Irritable bowel syndrome without diarrhea: Secondary | ICD-10-CM

## 2022-10-17 NOTE — Progress Notes (Signed)
Phone: [phone]   Subjective:  Patient presents today to establish care with me as their new primary care provider. Patient was formerly a patient of Dr. Carol Ada with [facility].  Chief Complaint  Patient presents with   Establish Care    Pt has a list of questions and concerns    See problem oriented charting  The following were reviewed and entered/updated in epic: Past Medical History:  Diagnosis Date   Allergy    Arthritis    NSVD (normal spontaneous vaginal delivery)    Postmenopausal    Psoriasis    Patient Active Problem List   Diagnosis Date Noted   Caregiver burden 10/17/2022    Priority: High   Stress 10/17/2022    Priority: High   Psoriasis 05/11/2017    Priority: Medium    Positive anti-CCP test 05/11/2017    Priority: Medium    IBS (irritable bowel syndrome) 07/09/2013    Priority: Medium    Hyperactive 07/09/2013    Priority: Medium    History of Plantar fasciitis 06/14/2017    Priority: 1.   Primary osteoarthritis of both feet 06/14/2017    Priority: 1.   Multiple joint pain 05/11/2017    Priority: 1.   DDD (degenerative disc disease), cervical 05/11/2017    Priority: 1.   DDD (degenerative disc disease), lumbar 05/11/2017    Priority: 1.   Primary osteoarthritis of both hands 05/11/2017   Other fatigue 05/11/2017   Seasonal and perennial allergic rhinitis 11/02/2013   Menopause syndrome 01/09/2013   Past Surgical History:  Procedure Laterality Date   CESAREAN SECTION  2000    Family History  Problem Relation Age of Onset   Hypertension Mother    Atrial fibrillation Mother    COPD Mother    Depression Mother    High Cholesterol Mother    Prostate cancer Father    Hearing loss Father    High Cholesterol Father    High blood pressure Father    High Cholesterol Brother    Atrial fibrillation Brother        63 when started   Stroke Maternal Grandmother    Alcohol abuse Maternal Grandfather     Medications- reviewed and  updated Current Outpatient Medications  Medication Sig Dispense Refill   ALPRAZolam (XANAX) 0.25 MG tablet Take 0.25 mg by mouth 2 (two) times daily.     VITAMIN D, ERGOCALCIFEROL, PO Take by mouth.     No current facility-administered medications for this visit.   Allergies-reviewed and updated Allergies  Allergen Reactions   Penicillins    Minocycline Rash   Social History   Social History Narrative   Married 1992. 2 children (son Ovid Curd- Civil engineer, contracting and engaged- and daughter Anda Kraft- film studies and psych from [place] in 2023).      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 Oropharynx normal Neck: no thyromegaly CV: RRR no murmurs rubs  or gallops Lungs: CTAB no crackles, wheeze, rhonchi Abdomen: soft/nontender/nondistended/normal bowel sounds. No rebound or guarding.  Ext: no edema Skin: warm, dry Neuro: grossly normal, moves all extremities, PERRLA   Assessment and Plan:   # Stress/caregiver burden S: Father is 42 and lives alone- very close. Mom is 66 with a lot of health issues- not legally divorced but lives apart . These were issues even before covid.  -trialed counseling with [facility] about [facility] years ago- but was released. Saw a different therapist and was not the best fit -would prefer to hold off on therapy but feels she needs it -feels has had attention issues since childhood.  -joined [employer] in January and has been very good for her -corticare B  supplements through Dr. Runell Gess- was told cortisol high in PM- is going to do follow up saliva test to see if makes a difference  -xanax mainly before going to see mom about 4 days a week through Dr. Tamala Julian or Dr. Runell Gess more recently -tried lexapro for a few months- didn't want to take- didn't feel different had low dose A/P: Stress/caregiver burden/anxiety focus of our visit today- a lot of this is focused on caring for her parents. She thinks therapy could be beneficial and I agree- refer to Dr. Theodis Shove  - discussed possible retrial SSRI if not improving- hoping with therapy may be able to cut down on alprazolam  #HM - not up to date on colonoscopy- refer to Dr. Hilarie Fredrickson for colonoscopy -especially with underlying gut health concerns- some AM frequency, bloating issues- she is considering seeing a nutritionist.  -vitamin D sometimes but never diagnosed as low  Recommended follow up: Return in about 6 months (around 04/17/2023) for physical or sooner if needed.Schedule b4 you leave.  Lab/Order associations:   ICD-10-CM   1. Stress  F43.9 Ambulatory referral to Psychology    2. Caregiver burden  Z63.6 Ambulatory referral to Psychology    3. Anxiety  F41.9 Ambulatory referral to Psychology    4. Psoriasis  L40.9     5. Hyperactive  F90.9     6. Irritable bowel syndrome, unspecified type  K58.9     7. Screen for colon cancer  Z12.11 Ambulatory referral to Gastroenterology     No orders of the defined types were placed in this encounter.  Time Spent: 47 minutes of total time (10:21 AM- 11:08 AM) was spent on the date of the encounter performing the following actions: chart review prior to seeing the patient, obtaining history, performing a medically necessary exam, counseling on the treatment options and plan and on caregiving/stress in general as well as need for colonoscopy, placing orders, and documenting in our EHR.   Return precautions advised.  Garret Reddish, MD

## 2022-10-17 NOTE — Patient Instructions (Addendum)
Please call [phone] to schedule a visit with [facility] - please tell the office you were directly referred by Dr. Yong Channel -prefer for you to see Dr. Theodis Shove if possible  [facility] contact Please call to schedule visit and/or procedure Address: [address], [place], [place] [address] Phone: [phone]  Thanks for signing release- we will look for records  Recommended follow up: Return in about 6 months (around 04/17/2023) for physical or sooner if needed.Schedule b4 you leave. -make sure 1 year past your last physical with [facility]

## 2022-12-06 ENCOUNTER — Ambulatory Visit (INDEPENDENT_AMBULATORY_CARE_PROVIDER_SITE_OTHER): Payer: BC Managed Care – PPO | Admitting: Behavioral Health

## 2022-12-06 DIAGNOSIS — F4323 Adjustment disorder with mixed anxiety and depressed mood: Secondary | ICD-10-CM | POA: Diagnosis not present

## 2022-12-06 DIAGNOSIS — F439 Reaction to severe stress, unspecified: Secondary | ICD-10-CM

## 2022-12-06 DIAGNOSIS — Z636 Dependent relative needing care at home: Secondary | ICD-10-CM | POA: Diagnosis not present

## 2022-12-06 NOTE — Progress Notes (Signed)
[facility] Counselor Initial Adult Exam  Name: Amanda Barber Date: 12/06/2022 MRN: [id number] DOB: 17-Dec-1960 PCP: Amanda Olp, MD  Time spent: 60 min In Person @ [facility] [place]:  Self    Paperwork requested: No   Reason for Visit /Presenting Problem: Elevated anx/dep & stress due to changes in the Family dynamics in the past 4 yrs. Pt is managing challenges in her own home as well as both of her Parents in sep homes.    Mental Status Exam: Appearance:   Casual and Neat     Behavior:  Appropriate and Sharing  Motor:  Normal  Speech/Language:   Clear and Coherent and Normal Rate  Affect:  Congruent  Mood:  normal  Thought process:  normal  Thought content:    WNL  Sensory/Perceptual disturbances:    WNL  Orientation:  oriented to person, place, and time/date  Attention:  Good  Concentration:  Good  Memory:  WNL  Fund of knowledge:   Good  Insight:    Good  Judgment:   Good  Impulse Control:  Good    Risk Assessment: Danger to Self:  No Self-injurious Behavior: No Danger to Others: No Duty to Warn:no Physical Aggression / Violence:No  Access to Firearms a concern: No  Gang Involvement:No  Patient / guardian was educated about steps to take if suicide or homicide risk level increases between visits: yes While future psychiatric events cannot be accurately predicted, the patient does not currently require acute inpatient psychiatric care and does not currently meet [facility] involuntary commitment criteria.  Substance Abuse History: Current substance abuse: No     Past Psychiatric History:   No previous psychological problems have been observed Outpatient Providers: Amanda Reddish, MD History of Psych Hospitalization: No  Psychological Testing: Attention/ADHD:  Pt unable to report, but went to a Practice on [place] never completed testing.     Abuse History:  Victim of: No.,  NA    Report needed: No. Victim of  Neglect:No. Perpetrator of  NA   Witness / Exposure to Domestic Violence: No   Protective Services Involvement: No  Witness to [employer] Violence:  No   Family History:  Family History  Problem Relation Age of Onset   Hypertension Mother    Atrial fibrillation Mother    COPD Mother    Depression Mother    High Cholesterol Mother    Prostate cancer Father    Hearing loss Father    High Cholesterol Father    High blood pressure Father    High Cholesterol Brother    Atrial fibrillation Brother        17 when started   Stroke Maternal Grandmother    Alcohol abuse Maternal Grandfather     Living situation: the patient lives with their spouse  Sexual Orientation: Straight  Relationship Status: married  Name of spouse: Amanda Barber (Husb works as a Warehouse manager If a parent, number of children / ages: 18yo Dtr Amanda Barber who is a Automotive engineer. 68yo Son Amanda Barber who is a Information systems manager & soon to be married to Amanda Barber Other who is an Therapist, sports.  Support Systems: spouse friends parents  Financial Stress:  No   Income/Employment/Disability: Pt is currently functioning as a Investment banker, operational for both elderly Parents who live sep. Her Amanda Barber is a 62yo Ret'd Higher Edu VP of Fundraising for [employer]. He did this until his 82nd year. Pt's Mother, 41yo Amanda Barber lives  in the marital home & until 4 yrs ago was not concerned for what Husb Amanda Barber did in life. Now she expresses hatred & resentment towards him wkly. Pt visits her Mother twice daily on average. Her Amanda Barber is on the discharge end of being in Rehab where he contracted COVID-19. Mother has health issues w/dbl hip replacement that cause her to currently be reliant on a walker. Mother expresses distaste for the dependency & not being able to drive.  Military Service: No   Educational History: Education: college graduate  Religion/Sprituality/World View: Unk  Any cultural differences that may affect / interfere  with treatment:  Other: None noted today    Recreation/Hobbies: Pt goes to [employer] daily & feels it does much good.  Stressors: Other: Pt is the only Sibling locally to care for her Parents. Her older Amanda Barber lives in [place] w/his Wife Amanda Barber. She rarely gets respite from [place] via her Bros    Pt is concerned for ADHD tendencies. She, "feels scattered" much of the time, although by nature she is a LT Planner & this has always worked, "well for me".   Pt is concerned for her use of Xanax 0.'25mg'$  prn. For the past 6 mos, she has taken this pill daily since going to visit her Mother so frequently.  Pt wants her own sense of peace & harmony. She is open to Support Grps a/o other resources. Pt has been engaged in psychotherapy in the past & prefers not to dredge up any trauma @ the present time.  Strengths: Supportive Relationships, Family, Friends, Hopefulness, Conservator, museum/gallery, and Able to Communicate Effectively  Barriers:  None noted today   Legal History: Pending legal issue / charges: The patient has no significant history of legal issues. History of legal issue / charges:  NA  Medical History/Surgical History: reviewed Past Medical History:  Diagnosis Date   Allergy    Arthritis    NSVD (normal spontaneous vaginal delivery)    Postmenopausal    Psoriasis     Past Surgical History:  Procedure Laterality Date   CESAREAN SECTION  2000    Medications: Current Outpatient Medications  Medication Sig Dispense Refill   ALPRAZolam (XANAX) 0.25 MG tablet Take 0.25 mg by mouth 2 (two) times daily.     VITAMIN D, ERGOCALCIFEROL, PO Take by mouth.     No current facility-administered medications for this visit.    Allergies  Allergen Reactions   Penicillins    Minocycline Rash    Diagnoses:  Adjustment disorder with mixed anxiety and depressed mood  Stress  Caregiver burden  Plan of Care: [name] will attend all scheduled sessions of psychotherapy every 2-3 wks.  Target Date:  03/07/2023  Progress: 3   Frequency: Twice monthly  Modality: Rondel Jumbo c/o "being a fixer" & always taking on everyone's emotions around her. She knows this is a quality of an Empath. She will secure & explore Willette Brace, MD's new book & see if this is helpful.   Target Date: 01/06/2023  Progress: 0  Frequency: Twice monthly  Modality: Indiv There is a legacy of trauma in the maternal side of the Family. Pt will use the suggestions provided today to reassure & comfort her Mother who likely has Attachment issues from her own childhood. This legacy is the probable reason Abraham has felt she needs, "to fix my Mother all my life". Marguetta will accept this Hx in her Mother's Family & the fact she cannot change anyone but herself &  her approach to others in her life. In doing this, Heike will exp a reduction in distress & her feelings of helplessness, therefore paving the way for her own sense of peace & harmony.  Target Date: 03/07/2023  Progress: 2  Frequency: Twice monthly  Modality: [name], LMFT

## 2022-12-06 NOTE — Progress Notes (Signed)
                Winfield Caba L Tarrance Januszewski, LMFT 

## 2022-12-13 ENCOUNTER — Encounter: Payer: Self-pay | Admitting: Internal Medicine

## 2022-12-19 ENCOUNTER — Ambulatory Visit (INDEPENDENT_AMBULATORY_CARE_PROVIDER_SITE_OTHER): Payer: BC Managed Care – PPO | Admitting: Behavioral Health

## 2022-12-19 DIAGNOSIS — F4323 Adjustment disorder with mixed anxiety and depressed mood: Secondary | ICD-10-CM

## 2022-12-20 NOTE — Progress Notes (Signed)
                Chayce Robbins L Jordan Caraveo, LMFT 

## 2022-12-20 NOTE — Progress Notes (Signed)
Amanda Barber Counselor/Therapist Progress Note  Patient ID: Amanda Barber, MRN: [id number],    Date: 12/20/2022  Time Spent: 52 min In Person @ [facility] [place] [facility]   Treatment Type: Individual Therapy  Reported Symptoms: Pt has reduction in anx/dep & stress due to Mother's care in Rehab @ [place]. She has taken the opportunity to care for herself & she & Husb Amanda Barber are travelling to [place] for an overnight stay this wknd.  Mental Status Exam: Appearance:  Casual and ready to work out!      Behavior: Appropriate and Sharing  Motor: Normal  Speech/Language:  Clear and Coherent and Normal Rate  Affect: Appropriate  Mood: normal  Thought process: normal  Thought content:   WNL  Sensory/Perceptual disturbances:   WNL  Orientation: oriented to person, place, and time/date  Attention: Good  Concentration: Good  Memory: WNL  Fund of knowledge:  Good  Insight:   Good  Judgment:  Good  Impulse Control: Good   Risk Assessment: Danger to Self:  No Self-injurious Behavior: No Danger to Others: No Duty to Warn:no Physical Aggression / Violence:No  Access to Firearms a concern: No  Gang Involvement:No   Subjective: Pt expresses relief today over the Palliative Care discussion [facility] has had w/her Mother recently. Mother is in [place] Unit & has lost 20# in the last 3 mos. Pt's Father is in [place] for his spiral Fx of the tibia. She has been given necessary respite & is grateful for this.  Interventions: Family Systems  Diagnosis:Adjustment disorder with mixed anxiety and depressed mood  Plan: Amanda Barber is beginning to reclaim herself & has inc'd her self care tremendously. She is going to the [place] & walking. She is also caring for her marital relationship. Over the next 2-3 wks, Amanda Barber will write down in her Notebook all the positive things that happen due to her inc'd positive attitude & note what she notices in the reclamation process.  Target Date:  2/30/2024  Progress: 4  Frequency: Twice monthly  Modality: [name], LMFT

## 2023-01-03 ENCOUNTER — Ambulatory Visit: Payer: BC Managed Care – PPO | Admitting: Behavioral Health

## 2023-03-21 ENCOUNTER — Encounter: Payer: BC Managed Care – PPO | Admitting: Family Medicine

## 2023-04-30 DIAGNOSIS — F418 Other specified anxiety disorders: Secondary | ICD-10-CM | POA: Diagnosis not present

## 2023-04-30 DIAGNOSIS — F32A Depression, unspecified: Secondary | ICD-10-CM | POA: Diagnosis not present

## 2023-05-28 ENCOUNTER — Telehealth: Payer: Self-pay | Admitting: Family Medicine

## 2023-05-28 NOTE — Telephone Encounter (Signed)
Patient called and wanted to let you that her mother passed on 07-27-2024but she was very thankful for how you treated her mother when she was here.

## 2023-06-05 ENCOUNTER — Encounter: Payer: Self-pay | Admitting: Family Medicine

## 2023-06-05 NOTE — Telephone Encounter (Signed)
Call would not go through-not sure why-I sent a MyChart message

## 2023-07-05 ENCOUNTER — Encounter (INDEPENDENT_AMBULATORY_CARE_PROVIDER_SITE_OTHER): Payer: Self-pay

## 2023-08-13 ENCOUNTER — Encounter: Payer: BC Managed Care – PPO | Admitting: Family Medicine

## 2023-11-07 DIAGNOSIS — Z139 Encounter for screening, unspecified: Secondary | ICD-10-CM | POA: Diagnosis not present

## 2023-11-07 DIAGNOSIS — N951 Menopausal and female climacteric states: Secondary | ICD-10-CM | POA: Diagnosis not present

## 2024-04-22 DIAGNOSIS — R0683 Snoring: Secondary | ICD-10-CM | POA: Diagnosis not present

## 2024-04-22 DIAGNOSIS — E559 Vitamin D deficiency, unspecified: Secondary | ICD-10-CM | POA: Diagnosis not present

## 2024-04-22 DIAGNOSIS — R7301 Impaired fasting glucose: Secondary | ICD-10-CM | POA: Diagnosis not present

## 2024-04-22 DIAGNOSIS — Z1331 Encounter for screening for depression: Secondary | ICD-10-CM | POA: Diagnosis not present

## 2024-04-22 DIAGNOSIS — E7849 Other hyperlipidemia: Secondary | ICD-10-CM | POA: Diagnosis not present

## 2024-04-29 DIAGNOSIS — Z6831 Body mass index (BMI) 31.0-31.9, adult: Secondary | ICD-10-CM | POA: Diagnosis not present

## 2024-04-29 DIAGNOSIS — Z01419 Encounter for gynecological examination (general) (routine) without abnormal findings: Secondary | ICD-10-CM | POA: Diagnosis not present

## 2024-06-06 DIAGNOSIS — F411 Generalized anxiety disorder: Secondary | ICD-10-CM | POA: Diagnosis not present

## 2024-06-06 DIAGNOSIS — Z5181 Encounter for therapeutic drug level monitoring: Secondary | ICD-10-CM | POA: Diagnosis not present

## 2024-06-06 DIAGNOSIS — Z79899 Other long term (current) drug therapy: Secondary | ICD-10-CM | POA: Diagnosis not present

## 2024-06-06 DIAGNOSIS — F902 Attention-deficit hyperactivity disorder, combined type: Secondary | ICD-10-CM | POA: Diagnosis not present

## 2024-06-11 DIAGNOSIS — H52223 Regular astigmatism, bilateral: Secondary | ICD-10-CM | POA: Diagnosis not present

## 2024-06-26 DIAGNOSIS — Z6828 Body mass index (BMI) 28.0-28.9, adult: Secondary | ICD-10-CM | POA: Diagnosis not present

## 2024-06-26 DIAGNOSIS — Z713 Dietary counseling and surveillance: Secondary | ICD-10-CM | POA: Diagnosis not present

## 2024-07-03 ENCOUNTER — Ambulatory Visit (INDEPENDENT_AMBULATORY_CARE_PROVIDER_SITE_OTHER): Admitting: Family Medicine

## 2024-07-03 ENCOUNTER — Encounter: Payer: Self-pay | Admitting: Family Medicine

## 2024-07-03 VITALS — BP 122/78 | HR 97 | Temp 97.9°F | Ht 66.5 in | Wt 173.6 lb

## 2024-07-03 DIAGNOSIS — Z0000 Encounter for general adult medical examination without abnormal findings: Secondary | ICD-10-CM | POA: Diagnosis not present

## 2024-07-03 DIAGNOSIS — Z13220 Encounter for screening for lipoid disorders: Secondary | ICD-10-CM

## 2024-07-03 DIAGNOSIS — F439 Reaction to severe stress, unspecified: Secondary | ICD-10-CM

## 2024-07-03 DIAGNOSIS — Z1211 Encounter for screening for malignant neoplasm of colon: Secondary | ICD-10-CM

## 2024-07-03 NOTE — Progress Notes (Signed)
 Phone [phone]   Subjective:  Patient presents today for their annual physical. Chief complaint-noted.   See problem oriented charting- ROS- full  review of systems was completed and negative Per full ROS sheet completed by patient except for topics noted under acute/chronic concerns  The following were reviewed and entered/updated in epic: Past Medical History:  Diagnosis Date   Allergy     Arthritis    NSVD (normal spontaneous vaginal delivery)    Postmenopausal    Psoriasis    Patient Active Problem List   Diagnosis Date Noted   Caregiver burden 10/17/2022    Priority: High   Stress 10/17/2022    Priority: High   Psoriasis 05/11/2017    Priority: Medium    Positive anti-CCP test 05/11/2017    Priority: Medium    IBS (irritable bowel syndrome) 07/09/2013    Priority: Medium    Hyperactive 07/09/2013    Priority: Medium    Menopause syndrome 01/09/2013    Priority: Medium    Seasonal and perennial allergic rhinitis 11/02/2013    Priority: Low   History of Plantar fasciitis 06/14/2017    Priority: 1.   Primary osteoarthritis of both feet 06/14/2017    Priority: 1.   Multiple joint pain 05/11/2017    Priority: 1.   Primary osteoarthritis of both hands 05/11/2017    Priority: 1.   DDD (degenerative disc disease), cervical 05/11/2017    Priority: 1.   DDD (degenerative disc disease), lumbar 05/11/2017    Priority: 1.   Past Surgical History:  Procedure Laterality Date   CESAREAN SECTION  2000    Family History  Problem Relation Age of Onset   Hypertension Mother    Atrial fibrillation Mother    COPD Mother    Depression Mother    High Cholesterol Mother    Prostate cancer Father        71 in 2025 in [place]   Hearing loss Father    High Cholesterol Father    High blood pressure Father    High Cholesterol Brother    Atrial fibrillation Brother        53 when started   Stroke Maternal Grandmother    Alcohol abuse Maternal Grandfather      Medications- reviewed and updated Current Outpatient Medications  Medication Sig Dispense Refill   ALPRAZolam (XANAX) 0.25 MG tablet Take 0.25 mg by mouth 2 (two) times daily. (Patient taking differently: Take 0.25 mg by mouth as needed.)     VITAMIN D , ERGOCALCIFEROL , PO Take by mouth.     No current facility-administered medications for this visit.    Allergies-reviewed and updated Allergies  Allergen Reactions   Penicillins    Minocycline Rash    Social History   Social History Narrative   Married 1992. 2 children (son Rankin- Sport and exercise psychologist and engaged- and daughter Mallie- film studies and psych from [place] in 2023).      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 Oropharynx normal Neck: no thyromegaly CV: RRR no murmurs rubs or gallops Lungs: CTAB no crackles, wheeze, rhonchi Abdomen: soft/nontender/nondistended/normal bowel sounds. No rebound or guarding.  Ext: no edema Skin: warm, dry Neuro: grossly normal, moves all extremities, PERRLA   Assessment and Plan   63 y.o. female presenting for annual physical.  Health Maintenance counseling: 1. Anticipatory guidance: Patient counseled regarding regular dental exams -q6 months, eye exams - yearly,  avoiding smoking and second hand smoke , limiting alcohol to 1 beverage per day - 4 a week , no illicit drugs .   2. Risk factor reduction:  Advised patient of need for regular exercise and diet rich and  fruits and vegetables to reduce risk of heart attack and stroke.  Exercise- [employer] 2.5 years classes 5 days a week- really enjoys it. May do free weight class.  Diet/weight management- She is taking a medication called liposlim which is compounded tirzepatide  (through pharmacy in [place]) to help with weight loss and is found effective- through Dr. Celesta- has been helpful. More energy on it and losing weight Wt Readings from Last 3 Encounters:  07/03/24 173 lb 9.6 oz (78.7 kg)  10/17/22 182 lb 3.2 oz (82.6 kg)  05/16/17 198 lb (89.8 kg)  3. Immunizations/screenings/ancillary studies- declines flu, COVID, shingrix, prevnar -recommended TDap at pharmacy  Immunization History  Administered Date(s) Administered   Unspecified SARS-COV-2 Vaccination 05/10/2020  4. Cervical cancer screening- pap smear with GYN Dr Celesta- requesting records 5. Breast cancer screening-  breast exam - with gynecology-  and mammogram - had this- requesting copy 6. Colon cancer screening - discussed cologuard vs colonoscopy- we will send cologuard but she is considering colonoscopy. Is anxious about the procedure- wants to avoid if possible.  7. Skin cancer screening- dermatology if needed. advised regular sunscreen use. Denies worrisome, changing, or new skin lesions.  8. Birth control/STD check- only active with  husband and postmenopausal 9. Osteoporosis screening at 64- bone density scheduled next month 10. Smoking associated screening - never smoker  Status of chronic or acute concerns   #Stress -apparently napping was a stress relief mechanism- has not needed this since passing of mother in June of last year after a few months- also gained some weight and is working on that as above - saw psychiatrist on hpc -  - has some old alprazolam and uses very sparingly -still caring for dad in [place] now with some relief- sees most days  #psychiatry has also given prescription for attention deficit disorder  but she hasn't taken it  #reviewed labs 11/07/23 with Dr. Celesta- normal CBC, CMP with sugar 101, lipid panel  with total 225, triglyceride(s) 65, HDL 87, LDL 127, thyroid  panel normal, a1c 5.3  Recommended follow up: Return in about 1 year (around 07/03/2025) for physical or sooner if needed.Schedule b4 you leave.  Lab/Order associations: fasting   ICD-10-CM   1. Preventative health care  Z00.00     2. Stress  F43.9     3. Screen for colon cancer  Z12.11 Cologuard    4. Screening for hyperlipidemia  Z13.220 Lipid panel      No orders of the defined types were placed in this encounter.   Return precautions advised.  Garnette Lukes, MD

## 2024-07-03 NOTE — Patient Instructions (Addendum)
 Health Maintenance Due  Topic Date Due   Cervical Cancer Screening (HPV/Pap Cotest)  04/09/2017   MAMMOGRAM  05/19/2024  Requesting records  Tetanus, Diphtheria, and Pertussis (Tdap) today good for 10 years  please let us  know if you have not received your Cologuard within 3 weeks-please complete after you receive -if you change your mind we can do colonoscopy instead  I am accepting family of current patients so can see your son- he can call in and reference you as his mother  Please stop by lab before you go- check cholesterol If you have mychart- we will send your results within 3 business days of us  receiving them.  If you do not have mychart- we will call you about results within 5 business days of us  receiving them.  *please also note that you will see labs on mychart as soon as they post. I will later go in and write notes on them- will say notes from Dr. Katrinka   Recommended follow up: Return in about 1 year (around 07/03/2025) for physical or sooner if needed.Schedule b4 you leave.

## 2024-07-04 ENCOUNTER — Ambulatory Visit: Payer: Self-pay | Admitting: Family Medicine

## 2024-07-04 LAB — LIPID PANEL
Cholesterol: 208 mg/dL — ABNORMAL HIGH (ref <200)
HDL: 84 mg/dL (ref >=50)
LDL Cholesterol (Calc): 108 mg/dL — ABNORMAL HIGH
Non-HDL Cholesterol (Calc): 124 mg/dL (ref <130)
Total CHOL/HDL Ratio: 2.5 (calc) (ref <5.0)
Triglycerides: 74 mg/dL (ref <150)

## 2024-08-07 ENCOUNTER — Encounter: Payer: Self-pay | Admitting: Family Medicine

## 2024-09-25 DIAGNOSIS — Z13820 Encounter for screening for osteoporosis: Secondary | ICD-10-CM | POA: Diagnosis not present

## 2024-10-01 ENCOUNTER — Encounter: Payer: Self-pay | Admitting: Family Medicine

## 2024-10-22 ENCOUNTER — Telehealth: Payer: Self-pay | Admitting: Family Medicine

## 2024-10-22 NOTE — Telephone Encounter (Signed)
-----   Message from Garnette Lukes sent at 10/22/2024  1:28 PM EST ----- Team can you update me on the status of this?  Please call her  she did not read her MyChart message from November on this topic ----- Message ----- From: SYSTEM Sent: 10/01/2024   1:05 AM EST To: Garnette MALVA Lukes, MD

## 2024-10-22 NOTE — Telephone Encounter (Signed)
 Patient returned call regarding cologuard. Patient stated she has not completed this test yet and she is unable to give me a timeline when she will be able to complete this due to her husband finding out he has cancer. She will make time to get this done.

## 2024-10-22 NOTE — Telephone Encounter (Signed)
 Left vm for patient requesting call back to go over cologuard.

## 2024-11-03 DIAGNOSIS — Z6825 Body mass index (BMI) 25.0-25.9, adult: Secondary | ICD-10-CM | POA: Diagnosis not present

## 2024-11-03 DIAGNOSIS — Z713 Dietary counseling and surveillance: Secondary | ICD-10-CM | POA: Diagnosis not present

## 2024-12-22 ENCOUNTER — Ambulatory Visit: Payer: Self-pay

## 2025-07-07 ENCOUNTER — Encounter: Admitting: Family Medicine
